# Patient Record
Sex: Female | Born: 1965 | Race: Black or African American | Hispanic: No | Smoking: Never smoker
Health system: Southern US, Community
[De-identification: ages and names within clinical notes are randomized; demographics above are authoritative.]

## PROBLEM LIST (undated history)

## (undated) ENCOUNTER — Emergency Department (HOSPITAL_COMMUNITY): Admission: EM | Payer: Self-pay | Source: Home / Self Care

## (undated) DIAGNOSIS — J45909 Unspecified asthma, uncomplicated: Secondary | ICD-10-CM

## (undated) DIAGNOSIS — M1990 Unspecified osteoarthritis, unspecified site: Secondary | ICD-10-CM

## (undated) DIAGNOSIS — I499 Cardiac arrhythmia, unspecified: Secondary | ICD-10-CM

## (undated) DIAGNOSIS — R0602 Shortness of breath: Secondary | ICD-10-CM

## (undated) DIAGNOSIS — R51 Headache: Secondary | ICD-10-CM

## (undated) HISTORY — DX: Unspecified asthma, uncomplicated: J45.909

---

## 2004-03-03 ENCOUNTER — Encounter: Admission: RE | Admit: 2004-03-03 | Discharge: 2004-03-03 | Payer: Self-pay | Admitting: Family Medicine

## 2004-03-17 ENCOUNTER — Encounter: Admission: RE | Admit: 2004-03-17 | Discharge: 2004-03-17 | Payer: Self-pay | Admitting: Anesthesiology

## 2004-06-09 ENCOUNTER — Other Ambulatory Visit: Admission: RE | Admit: 2004-06-09 | Discharge: 2004-06-09 | Payer: Self-pay | Admitting: Obstetrics and Gynecology

## 2004-06-17 ENCOUNTER — Ambulatory Visit (HOSPITAL_COMMUNITY): Admission: RE | Admit: 2004-06-17 | Discharge: 2004-06-17 | Payer: Self-pay | Admitting: Obstetrics and Gynecology

## 2004-06-27 ENCOUNTER — Encounter: Admission: RE | Admit: 2004-06-27 | Discharge: 2004-06-27 | Payer: Self-pay | Admitting: Obstetrics and Gynecology

## 2004-09-26 ENCOUNTER — Emergency Department (HOSPITAL_COMMUNITY): Admission: EM | Admit: 2004-09-26 | Discharge: 2004-09-26 | Payer: Self-pay | Admitting: Emergency Medicine

## 2005-07-06 ENCOUNTER — Inpatient Hospital Stay (HOSPITAL_COMMUNITY): Admission: AD | Admit: 2005-07-06 | Discharge: 2005-07-06 | Payer: Self-pay | Admitting: Obstetrics & Gynecology

## 2005-09-30 ENCOUNTER — Inpatient Hospital Stay (HOSPITAL_COMMUNITY): Admission: AD | Admit: 2005-09-30 | Discharge: 2005-09-30 | Payer: Self-pay | Admitting: Obstetrics and Gynecology

## 2005-10-01 ENCOUNTER — Emergency Department (HOSPITAL_COMMUNITY): Admission: EM | Admit: 2005-10-01 | Discharge: 2005-10-01 | Payer: Self-pay | Admitting: Emergency Medicine

## 2005-11-23 ENCOUNTER — Emergency Department (HOSPITAL_COMMUNITY): Admission: EM | Admit: 2005-11-23 | Discharge: 2005-11-23 | Payer: Self-pay | Admitting: Emergency Medicine

## 2005-11-29 ENCOUNTER — Emergency Department (HOSPITAL_COMMUNITY): Admission: EM | Admit: 2005-11-29 | Discharge: 2005-11-29 | Payer: Self-pay | Admitting: Emergency Medicine

## 2006-06-03 ENCOUNTER — Inpatient Hospital Stay (HOSPITAL_COMMUNITY): Admission: AD | Admit: 2006-06-03 | Discharge: 2006-06-03 | Payer: Self-pay | Admitting: Gynecology

## 2006-06-27 ENCOUNTER — Ambulatory Visit: Payer: Self-pay | Admitting: Obstetrics and Gynecology

## 2006-08-30 ENCOUNTER — Encounter: Admission: RE | Admit: 2006-08-30 | Discharge: 2006-09-13 | Payer: Self-pay | Admitting: Orthopedic Surgery

## 2007-04-14 ENCOUNTER — Emergency Department (HOSPITAL_COMMUNITY): Admission: EM | Admit: 2007-04-14 | Discharge: 2007-04-14 | Payer: Self-pay | Admitting: Emergency Medicine

## 2007-11-26 ENCOUNTER — Emergency Department (HOSPITAL_COMMUNITY): Admission: EM | Admit: 2007-11-26 | Discharge: 2007-11-26 | Payer: Self-pay | Admitting: Emergency Medicine

## 2007-11-28 ENCOUNTER — Ambulatory Visit: Payer: Self-pay | Admitting: Family Medicine

## 2007-11-28 LAB — CONVERTED CEMR LAB: TSH: 0.262 microintl units/mL — ABNORMAL LOW (ref 0.350–5.50)

## 2007-12-05 ENCOUNTER — Encounter (INDEPENDENT_AMBULATORY_CARE_PROVIDER_SITE_OTHER): Payer: Self-pay | Admitting: Family Medicine

## 2007-12-05 ENCOUNTER — Ambulatory Visit (HOSPITAL_COMMUNITY): Admission: RE | Admit: 2007-12-05 | Discharge: 2007-12-05 | Payer: Self-pay | Admitting: Family Medicine

## 2008-01-09 ENCOUNTER — Ambulatory Visit: Payer: Self-pay | Admitting: *Deleted

## 2008-01-09 ENCOUNTER — Emergency Department (HOSPITAL_COMMUNITY): Admission: EM | Admit: 2008-01-09 | Discharge: 2008-01-09 | Payer: Self-pay | Admitting: Emergency Medicine

## 2008-01-21 ENCOUNTER — Ambulatory Visit: Payer: Self-pay | Admitting: Internal Medicine

## 2008-02-20 ENCOUNTER — Encounter: Admission: RE | Admit: 2008-02-20 | Discharge: 2008-02-20 | Payer: Self-pay | Admitting: Family Medicine

## 2008-02-20 ENCOUNTER — Other Ambulatory Visit: Admission: RE | Admit: 2008-02-20 | Discharge: 2008-02-20 | Payer: Self-pay | Admitting: Interventional Radiology

## 2008-02-20 ENCOUNTER — Encounter (INDEPENDENT_AMBULATORY_CARE_PROVIDER_SITE_OTHER): Payer: Self-pay | Admitting: Interventional Radiology

## 2008-03-03 ENCOUNTER — Encounter (INDEPENDENT_AMBULATORY_CARE_PROVIDER_SITE_OTHER): Payer: Self-pay | Admitting: Internal Medicine

## 2008-03-03 ENCOUNTER — Ambulatory Visit: Payer: Self-pay | Admitting: Vascular Surgery

## 2008-03-03 ENCOUNTER — Inpatient Hospital Stay (HOSPITAL_COMMUNITY): Admission: EM | Admit: 2008-03-03 | Discharge: 2008-03-05 | Payer: Self-pay | Admitting: Emergency Medicine

## 2008-06-11 ENCOUNTER — Ambulatory Visit: Payer: Self-pay | Admitting: Internal Medicine

## 2008-07-03 ENCOUNTER — Ambulatory Visit: Payer: Self-pay | Admitting: Family Medicine

## 2008-07-03 LAB — CONVERTED CEMR LAB
AST: 16 units/L (ref 0–37)
Albumin: 3.8 g/dL (ref 3.5–5.2)
Alkaline Phosphatase: 41 units/L (ref 39–117)
BUN: 13 mg/dL (ref 6–23)
Basophils Relative: 0 % (ref 0–1)
Eosinophils Absolute: 0 10*3/uL (ref 0.0–0.7)
MCHC: 29.9 g/dL — ABNORMAL LOW (ref 30.0–36.0)
MCV: 72.6 fL — ABNORMAL LOW (ref 78.0–100.0)
Neutrophils Relative %: 53 % (ref 43–77)
Platelets: 252 10*3/uL (ref 150–400)
Potassium: 4.1 meq/L (ref 3.5–5.3)
RDW: 17.3 % — ABNORMAL HIGH (ref 11.5–15.5)
Sodium: 138 meq/L (ref 135–145)
Total Protein: 7.1 g/dL (ref 6.0–8.3)

## 2008-10-13 ENCOUNTER — Ambulatory Visit: Payer: Self-pay | Admitting: Family Medicine

## 2008-10-13 LAB — CONVERTED CEMR LAB
Basophils Absolute: 0 10*3/uL (ref 0.0–0.1)
Eosinophils Relative: 0 % (ref 0–5)
HCT: 32.7 % — ABNORMAL LOW (ref 36.0–46.0)
Hemoglobin: 9.6 g/dL — ABNORMAL LOW (ref 12.0–15.0)
Lymphocytes Relative: 35 % (ref 12–46)
Lymphs Abs: 1.9 10*3/uL (ref 0.7–4.0)
Monocytes Absolute: 0.4 10*3/uL (ref 0.1–1.0)
Monocytes Relative: 7 % (ref 3–12)
RDW: 19.2 % — ABNORMAL HIGH (ref 11.5–15.5)

## 2009-03-18 ENCOUNTER — Ambulatory Visit: Payer: Self-pay | Admitting: Family Medicine

## 2009-03-23 ENCOUNTER — Ambulatory Visit (HOSPITAL_COMMUNITY): Admission: RE | Admit: 2009-03-23 | Discharge: 2009-03-23 | Payer: Self-pay | Admitting: Family Medicine

## 2009-04-20 ENCOUNTER — Ambulatory Visit: Payer: Self-pay | Admitting: Family Medicine

## 2009-07-01 ENCOUNTER — Ambulatory Visit: Payer: Self-pay | Admitting: Family Medicine

## 2009-07-13 ENCOUNTER — Encounter (INDEPENDENT_AMBULATORY_CARE_PROVIDER_SITE_OTHER): Payer: Self-pay | Admitting: Family Medicine

## 2009-07-13 ENCOUNTER — Ambulatory Visit: Payer: Self-pay | Admitting: Family Medicine

## 2009-07-30 ENCOUNTER — Emergency Department (HOSPITAL_COMMUNITY): Admission: EM | Admit: 2009-07-30 | Discharge: 2009-07-30 | Payer: Self-pay | Admitting: Emergency Medicine

## 2009-10-14 ENCOUNTER — Ambulatory Visit: Payer: Self-pay | Admitting: Internal Medicine

## 2009-10-14 ENCOUNTER — Encounter (INDEPENDENT_AMBULATORY_CARE_PROVIDER_SITE_OTHER): Payer: Self-pay | Admitting: Family Medicine

## 2009-10-14 LAB — CONVERTED CEMR LAB
Basophils Absolute: 0 10*3/uL (ref 0.0–0.1)
Basophils Relative: 0 % (ref 0–1)
Eosinophils Absolute: 0 10*3/uL (ref 0.0–0.7)
Eosinophils Relative: 1 % (ref 0–5)
Free T4: 1.14 ng/dL (ref 0.80–1.80)
HCT: 31.2 % — ABNORMAL LOW (ref 36.0–46.0)
Hemoglobin: 9.8 g/dL — ABNORMAL LOW (ref 12.0–15.0)
LDL Cholesterol: 145 mg/dL — ABNORMAL HIGH (ref 0–99)
MCHC: 31.4 g/dL (ref 30.0–36.0)
MCV: 77 fL — ABNORMAL LOW (ref 78.0–100.0)
Monocytes Absolute: 0.5 10*3/uL (ref 0.1–1.0)
RDW: 15 % (ref 11.5–15.5)
Total CHOL/HDL Ratio: 4.7
VLDL: 24 mg/dL (ref 0–40)

## 2009-10-22 ENCOUNTER — Encounter (INDEPENDENT_AMBULATORY_CARE_PROVIDER_SITE_OTHER): Payer: Self-pay | Admitting: *Deleted

## 2009-12-31 ENCOUNTER — Ambulatory Visit: Payer: Self-pay | Admitting: Family Medicine

## 2010-03-02 ENCOUNTER — Ambulatory Visit: Payer: Self-pay | Admitting: Internal Medicine

## 2010-03-02 ENCOUNTER — Encounter (INDEPENDENT_AMBULATORY_CARE_PROVIDER_SITE_OTHER): Payer: Self-pay | Admitting: Family Medicine

## 2010-03-02 LAB — CONVERTED CEMR LAB
AST: 18 units/L (ref 0–37)
Albumin: 3.8 g/dL (ref 3.5–5.2)
Alkaline Phosphatase: 36 units/L — ABNORMAL LOW (ref 39–117)
BUN: 14 mg/dL (ref 6–23)
Basophils Absolute: 0 10*3/uL (ref 0.0–0.1)
Basophils Relative: 0 % (ref 0–1)
Eosinophils Relative: 1 % (ref 0–5)
Lymphocytes Relative: 46 % (ref 12–46)
MCHC: 29.2 g/dL — ABNORMAL LOW (ref 30.0–36.0)
Neutro Abs: 1.4 10*3/uL — ABNORMAL LOW (ref 1.7–7.7)
Platelets: 240 10*3/uL (ref 150–400)
Potassium: 3.6 meq/L (ref 3.5–5.3)
RDW: 19.8 % — ABNORMAL HIGH (ref 11.5–15.5)
Sodium: 140 meq/L (ref 135–145)
Total Bilirubin: 0.3 mg/dL (ref 0.3–1.2)
Vit D, 25-Hydroxy: 25 ng/mL — ABNORMAL LOW (ref 30–89)

## 2010-03-10 ENCOUNTER — Ambulatory Visit: Payer: Self-pay | Admitting: Family Medicine

## 2010-07-07 ENCOUNTER — Encounter (INDEPENDENT_AMBULATORY_CARE_PROVIDER_SITE_OTHER): Payer: Self-pay | Admitting: Family Medicine

## 2010-07-07 LAB — CONVERTED CEMR LAB
Basophils Relative: 0 % (ref 0–1)
Eosinophils Absolute: 0 10*3/uL (ref 0.0–0.7)
Eosinophils Relative: 1 % (ref 0–5)
HCT: 32.8 % — ABNORMAL LOW (ref 36.0–46.0)
Hemoglobin: 9.9 g/dL — ABNORMAL LOW (ref 12.0–15.0)
Lymphs Abs: 1.6 10*3/uL (ref 0.7–4.0)
MCHC: 30.2 g/dL (ref 30.0–36.0)
MCV: 78.7 fL (ref 78.0–100.0)
Monocytes Absolute: 0.5 10*3/uL (ref 0.1–1.0)
Monocytes Relative: 10 % (ref 3–12)
Neutrophils Relative %: 55 % (ref 43–77)
RBC: 4.17 M/uL (ref 3.87–5.11)
Vit D, 25-Hydroxy: 31 ng/mL (ref 30–89)
WBC: 4.8 10*3/uL (ref 4.0–10.5)

## 2010-08-22 ENCOUNTER — Ambulatory Visit (HOSPITAL_COMMUNITY)
Admission: RE | Admit: 2010-08-22 | Discharge: 2010-08-22 | Payer: Self-pay | Source: Home / Self Care | Admitting: Family Medicine

## 2010-10-09 ENCOUNTER — Encounter: Payer: Self-pay | Admitting: Family Medicine

## 2010-10-17 ENCOUNTER — Ambulatory Visit (HOSPITAL_COMMUNITY)
Admission: RE | Admit: 2010-10-17 | Discharge: 2010-10-17 | Payer: Self-pay | Source: Home / Self Care | Attending: Family Medicine | Admitting: Family Medicine

## 2010-10-18 NOTE — Letter (Signed)
Summary: Generic Letter  [facility]  [address]   [place], [place] [address]   Phone: [phone]  Fax: [phone]    10/22/2009  [facility] [address] [place], [place]  [address]  Dear Ms. Vanderhoef,  You have low Vitamin D start taking Vitamin D 50,000 International Units  one tablet weekly for 12 weeks. Your prescription is enclosed. You also need to take Calcium 1,200 mg one tablet daily which you can purchase at your drugstore.    After the 12 weeks start taking over the counter Vitamin D 1,000 International Units and continue with the calcium. Try to get 15 minutes of sun exposure daily.  Increase Calcium rich foods. If you have any questions please feel free to call us.         Sincerely,   Gaylyn Cheers RN

## 2010-11-04 ENCOUNTER — Emergency Department (HOSPITAL_COMMUNITY)
Admission: EM | Admit: 2010-11-04 | Discharge: 2010-11-05 | Disposition: A | Discharge status: Home / Self Care | Payer: Medicaid Other | Attending: Emergency Medicine | Admitting: Emergency Medicine

## 2010-11-04 DIAGNOSIS — M545 Low back pain, unspecified: Secondary | ICD-10-CM | POA: Insufficient documentation

## 2010-11-04 DIAGNOSIS — R109 Unspecified abdominal pain: Secondary | ICD-10-CM | POA: Insufficient documentation

## 2010-11-04 DIAGNOSIS — G8929 Other chronic pain: Secondary | ICD-10-CM | POA: Insufficient documentation

## 2010-11-05 LAB — URINALYSIS, ROUTINE W REFLEX MICROSCOPIC
Bilirubin Urine: NEGATIVE
Nitrite: NEGATIVE
Specific Gravity, Urine: 1.027 (ref 1.005–1.030)
Urobilinogen, UA: 0.2 mg/dL (ref 0.0–1.0)
pH: 6.5 (ref 5.0–8.0)

## 2010-11-05 LAB — POCT PREGNANCY, URINE: Preg Test, Ur: NEGATIVE

## 2010-12-21 LAB — POCT PREGNANCY, URINE: Preg Test, Ur: NEGATIVE

## 2010-12-21 LAB — CBC
HCT: 32.5 % — ABNORMAL LOW (ref 36.0–46.0)
Platelets: 192 10*3/uL (ref 150–400)
RBC: 4.06 MIL/uL (ref 3.87–5.11)
WBC: 5.5 10*3/uL (ref 4.0–10.5)

## 2010-12-21 LAB — URINALYSIS, ROUTINE W REFLEX MICROSCOPIC
Nitrite: NEGATIVE
Protein, ur: NEGATIVE mg/dL
Specific Gravity, Urine: 1.009 (ref 1.005–1.030)
Urobilinogen, UA: 0.2 mg/dL (ref 0.0–1.0)

## 2010-12-21 LAB — RPR: RPR Ser Ql: NONREACTIVE

## 2010-12-21 LAB — URINE MICROSCOPIC-ADD ON

## 2010-12-21 LAB — WET PREP, GENITAL
Trich, Wet Prep: NONE SEEN
WBC, Wet Prep HPF POC: NONE SEEN
Yeast Wet Prep HPF POC: NONE SEEN

## 2010-12-21 LAB — GC/CHLAMYDIA PROBE AMP, GENITAL: GC Probe Amp, Genital: NEGATIVE

## 2011-01-31 NOTE — H&P (Signed)
[name], [name]          ACCOUNT NO.:  [id number]   MEDICAL RECORD NO.:  [id number]          PATIENT TYPE:  INP   LOCATION:  1826                         FACILITY:  [facility]   PHYSICIAN:  Lonia Blood, M.D.      DATE OF BIRTH:  25-Jun-1966   DATE OF ADMISSION:  03/02/2008  DATE OF DISCHARGE:                              HISTORY & PHYSICAL   PRIMARY CARE PHYSICIAN:  Programmer, multimedia.   PRESENTING COMPLAINT:  Passing out.   HISTORY OF PRESENT ILLNESS:  The patient is a 45 year old [place]  immigrant with history of exertional dyspnea back in March, otherwise no  specific or significant past medical history presenting with complaint  of altered mental status.  The patient was apparently found to have  passed out yesterday.  She was found unresponsive and was not clear what  happened.  There was associated nausea and abdominal pain when the  patient came to.  The patient is currently drowsy, but able to give some  history.  She reported my heart feeling funny.  She described it as  some unusual feelings, more like aching, but not frank sharp pain.  She  also reported some pedal edema.  Per patient, she was given some  medicine before that makes her go to the bathroom a lot, sounded more  like diuretics.  She had a similar episode before when she was seen with  exertional dyspnea back in March.  She had a 2-D echo at that time that  showed an EF of 55-60%, an overall normal 2-D echocardiogram.  She  denied any headaches.  No fever.  No cough.  So far no vomiting,  although she was nauseated.   ALLERGIES:  She has no known drug allergies.   MEDICATIONS:  No specific medicine at this point, although the patient  reported taking some form of diuretics.   SOCIAL HISTORY:  The patient is married.  Lives in [place].  She is  an immigrant from [place].  Denied any tobacco, alcohol, or IV drug use.   FAMILY HISTORY:  No significant family history of any heart disease or  stroke.   REVIEW OF SYSTEMS:  A 12-point review of system is negative, except per  HPI.   PHYSICAL EXAMINATION:  VITAL SIGNS:  Temperature is 97.4, blood pressure  is 113/93, pulse 60, respiratory rate 22, and sat is 100% on room air.  GENERAL:  The patient is drowsy, but arousable in no acute distress.  HEENT:  PERRL. EOMI.  NECK:  Supple.  No JVD.  No lymphadenopathy.  RESPIRATORY:  She has good air entry bilaterally.  No wheezes.  No  rales.  CARDIOVASCULAR SYSTEM:  Shows S1 and S2.  No murmur.  ABDOMEN:  Soft and nontender with positive bowel sounds.  EXTREMITIES:  No edema, cyanosis, or clubbing.   LABORATORY DATA:  White count is 4.9 with normal differentials.  Hemoglobin 10.3 with MCV of 76.4.  Urinalysis is negative.  Initial  cardiac enzymes negative.  Sodium 136, potassium 3.8, chloride 107, CO2  21, glucose 97, BUN 6, creatinine 0.7, calcium 8.5, total protein 6.0,  albumin 3.2,  and normal LFTs.  D-dimer is 0.68.  Urine drug screen is  negative.  Head CT without contrast is negative.  CT angiogram of the  chest showed no evidence of pulmonary embolus.  There is enlargement of  thyroid gland, particularly the left lobe, which measured approximately  4.1 cm x 4.5 cm x 6 cm.  No lymphadenopathy.  No pleural or pericardial  effusions.   ASSESSMENT:  This is a 45 year old female presented with altered mental  status that sounds more like some form of arrhythmia.  Her EKG is  showing sinus bradycardia with a rate of 59.  No significant ST-T wave  changes, but poor R-wave progression.  The differentials for her  symptoms are varied.  It could be some arrhythmias leading to syncopal  episode.  The patient could have a CVA as well.  Another possibility is  this could be a seizure disorder.   PLAN:  1. Altered mental status.  We will admit the patient for syncopal      episode, work her up, check her MRI and MRA of the brain, carotid      Doppler with a 2-D echo.  Check B12 and  RPR level.  Put her on      telemetry for close monitoring.  I will follow her progress.  2. Chest discomfort or pain.  This again may be secondary to some      arrhythmias or true chest pain.  We will check serial cardiac      enzymes, TSH, and BNP; will see how the patient does in the      hospital.  3. History of exertional dyspnea.  This may be diastolic dysfunction      if anything.  As indicated, the patient had a 2-D echo back in      March that was essentially negative for anything acute.  We will      have a followup closely with a new 2-D echo and see if anything new      comes off.  Otherwise,  we will treat the patient as if she had a      CVA versus seizure.  EEG would also be ordered to be sure.      Lonia Blood, M.D.  Electronically Signed     LG/MEDQ  D:  03/03/2008  T:  03/03/2008  Job:  [id number]

## 2011-01-31 NOTE — Discharge Summary (Signed)
NAMEJORDANNE, [name]          ACCOUNT NO.:  [id number]   MEDICAL RECORD NO.:  [id number]           PATIENT TYPE:   LOCATION:                                 FACILITY:   PHYSICIAN:  Madaline Savage, MD        DATE OF BIRTH:  03-18-1966   DATE OF ADMISSION:  DATE OF DISCHARGE:                               DISCHARGE SUMMARY   PRIMARY CARE PHYSICIAN:  [facility].   DISCHARGE DIAGNOSES:  1. Syncope, likely vasovagal syncope.  2. Chest discomfort, ruled out acute coronary syndrome.   DISCHARGE MEDICATIONS:  None.   HISTORY OF PRESENT ILLNESS:  For full history and physical, see the  history and physical dictated by Dr. August Luz on March 02, 2008.  In short,  [name] is a 45 year old African lady who comes in with exertional  dyspnea in March but no other complaints here.  Came after she passed  out after she was having nausea and vomiting.  She was also complaining  of some chest discomfort.  She was admitted to work up for causes for  syncope.   PROCEDURES DONE IN THE HOSPITAL:  1. She had carotid Dopplers done on March 03, 2008, which showed no      significant plaque or ICA stenosis.  2. She had a 2-D echocardiogram done on March 03, 2008, which showed      normal left ventricular systolic function with ejection fraction of      55-65%, no diagnostic left ventricular wall motion abnormalities      were seen, and the left ventricular diastolic function parameters      were normal.  3. She had an EEG done on March 04, 2008, which showed normal study in      awake and drowsy and sleep state.  4. She had CT angiogram of the chest done on March 03, 2008, which      showed negative for pulmonary embolism or acute process and marked      enlargement of thyroid gland, particularly of the left lobe.  The      patient has had a thyroid ultrasound with biopsies here.  5. She had a CT of the head without contrast done on March 03, 2008,      which showed negative CT scan of the brain.  6.  She had an MRI and MRA of the brain done on March 03, 1008, which      showed no acute infarct, mild nonspecific white matter type      changes, minimal deposition within the globus pallidus bilaterally      as noted above and the MRA showed no large or medium vessel      significant stenosis or occlusion.   PROBLEM LIST:  1. Syncope.  [name] came here with syncope and some chest      discomfort.  She was admitted and a workup for syncope was done.      Her MRI, MRA, CT scan, carotid Dopplers, echocardiogram, and EEG      were all negative.  She was watched on the cardiac monitor for the  last 48 hours and it did not show any arrhythmias.  At this time      from the history, this is most likely a vasovagal syncope.  She      does not need any further treatment for it at this time.  2. Chest discomfort.  [name]  had atypical chest discomfort.  She      had 3 sets of cardiac markers which were negative.  Her EKG did not      show any ST-T wave changes.  She also had an elevated D-dimer and      so had a CT of the chest for PE protocol which was negative.  I      suspect this is likely secondary to her stress at home.  She did      also complain of some exertional dyspnea in the past, but her      echocardiogram did not show any abnormalities.  She will need      further workup as an outpatient by her primary care doctor.   DISPOSITION:  She is now being discharged home in stable condition.   FOLLOW UP:  She is asked to follow up with doctors at [facility] in 2-4  weeks.      Madaline Savage, MD  Electronically Signed     PKN/MEDQ  D:  03/05/2008  T:  03/05/2008  Job:  [id number]

## 2011-01-31 NOTE — Procedures (Signed)
EEG NUMBER:  [id number].   REQUESTING PHYSICIAN:  Lonia Blood, MD   CLINICAL HISTORY:  This is a 45 year old admitted with syncope.  EEG is  performed for evaluation.  The patient is described as awake and drowsy.  This is a portable EEG done at the bedside.   DESCRIPTION:  The dominant waking rhythm in this tracing is seen only  occasionally, that appears to be a moderate amplitude alpha rhythm of 10  Hz, which predominates posteriorly without abnormal asymmetry.  Mostly,  recording was made in drowsiness and asleep, as evidenced by  fragmentation of the background and generalized attenuation of rhythms  into a low amplitude theta state, with diffuse low amplitude fast  activity appearing over frontal central regions.  As asleep progresses,  some higher amplitude theta rhythms appeared, and eventually some  characteristics of stage II sleep including vertex waves and K-complexes  are seen.  No focal slowing is noted and no epileptiform discharges were  seen.  Single channel devoted to EKG revealed sinus rhythm throughout  with a rate approximately 66 beats per minute.   CONCLUSION:  Normal study in awake, drowsy. and sleep state.      Michael L. Thad Ranger, M.D.  Electronically Signed     ZOX:WRUE  D:  03/04/2008 17:19:44  T:  03/05/2008 05:14:57  Job #:  [id number]
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
## 2011-06-12 LAB — URINALYSIS, ROUTINE W REFLEX MICROSCOPIC
Bilirubin Urine: NEGATIVE
Ketones, ur: NEGATIVE
Leukocytes, UA: NEGATIVE
Nitrite: NEGATIVE
Urobilinogen, UA: 0.2

## 2011-06-12 LAB — B-NATRIURETIC PEPTIDE (CONVERTED LAB): Pro B Natriuretic peptide (BNP): <30

## 2011-06-12 LAB — I-STAT 8, (EC8 V) (CONVERTED LAB)
Acid-base deficit: 5 — ABNORMAL HIGH
Bicarbonate: 19.1 — ABNORMAL LOW
Potassium: 4.2
TCO2: 20
pH, Ven: 7.365 — ABNORMAL HIGH

## 2011-06-12 LAB — CBC
HCT: 33.9 — ABNORMAL LOW
Hemoglobin: 11 — ABNORMAL LOW
MCHC: 32.4
MCV: 75.8 — ABNORMAL LOW
RBC: 4.48

## 2011-06-12 LAB — DIFFERENTIAL
Basophils Relative: 0
Eosinophils Absolute: 0
Eosinophils Relative: 1
Monocytes Absolute: 0.6
Monocytes Relative: 13 — ABNORMAL HIGH
Neutro Abs: 2.7

## 2011-06-12 LAB — URINE CULTURE: Colony Count: NO GROWTH

## 2011-06-12 LAB — POCT CARDIAC MARKERS
CKMB, poc: <1 — ABNORMAL LOW
Myoglobin, poc: 38.7
Troponin i, poc: <0.05

## 2011-06-12 LAB — PREGNANCY, URINE: Preg Test, Ur: NEGATIVE

## 2011-06-15 LAB — COMPREHENSIVE METABOLIC PANEL
AST: 23
Albumin: 3.2 — ABNORMAL LOW
BUN: 6
Chloride: 107
Creatinine, Ser: 0.7
GFR calc Af Amer: >60
Potassium: 3.8
Total Bilirubin: 0.5
Total Protein: 6.1

## 2011-06-15 LAB — BASIC METABOLIC PANEL
BUN: 8
CO2: 29
Glucose, Bld: 84
Potassium: 3.5
Sodium: 139

## 2011-06-15 LAB — TROPONIN I: Troponin I: <0.01

## 2011-06-15 LAB — URINALYSIS, ROUTINE W REFLEX MICROSCOPIC
Glucose, UA: NEGATIVE
Ketones, ur: NEGATIVE
Nitrite: NEGATIVE
Protein, ur: NEGATIVE
Urobilinogen, UA: 0.2

## 2011-06-15 LAB — VITAMIN B12: Vitamin B-12: 995 — ABNORMAL HIGH (ref 211–911)

## 2011-06-15 LAB — CARDIAC PANEL(CRET KIN+CKTOT+MB+TROPI)
CK, MB: 0.9
Total CK: 68

## 2011-06-15 LAB — DIFFERENTIAL
Eosinophils Absolute: 0.1
Eosinophils Relative: 2
Lymphocytes Relative: 39
Lymphs Abs: 1.9
Monocytes Relative: 12

## 2011-06-15 LAB — TSH: TSH: 0.486

## 2011-06-15 LAB — POCT CARDIAC MARKERS
CKMB, poc: <1 — ABNORMAL LOW
Myoglobin, poc: 51.5
Operator id: 277751
Operator id: 277751
Troponin i, poc: 0.1 — ABNORMAL HIGH
Troponin i, poc: <0.05

## 2011-06-15 LAB — CBC
HCT: 29.8 — ABNORMAL LOW
HCT: 31.5 — ABNORMAL LOW
Hemoglobin: 9.9 — ABNORMAL LOW
MCHC: 33.1
MCV: 74.6 — ABNORMAL LOW
MCV: 76.4 — ABNORMAL LOW
RBC: 4.13
RDW: 18.3 — ABNORMAL HIGH
WBC: 4.9

## 2011-06-15 LAB — PREGNANCY, URINE: Preg Test, Ur: NEGATIVE

## 2011-06-15 LAB — LIPID PANEL
Cholesterol: 196
LDL Cholesterol: 138 — ABNORMAL HIGH

## 2011-06-15 LAB — D-DIMER, QUANTITATIVE: D-Dimer, Quant: 0.68 — ABNORMAL HIGH

## 2011-06-15 LAB — CK TOTAL AND CKMB (NOT AT ARMC)
CK, MB: 1
Total CK: 73

## 2011-06-15 LAB — B-NATRIURETIC PEPTIDE (CONVERTED LAB): Pro B Natriuretic peptide (BNP): 39

## 2011-06-15 LAB — RAPID URINE DRUG SCREEN, HOSP PERFORMED: Barbiturates: NOT DETECTED

## 2011-06-20 ENCOUNTER — Emergency Department (HOSPITAL_COMMUNITY)
Admission: EM | Admit: 2011-06-20 | Discharge: 2011-06-20 | Disposition: A | Discharge status: Home / Self Care | Payer: Medicaid Other | Attending: Emergency Medicine | Admitting: Emergency Medicine

## 2011-06-20 ENCOUNTER — Emergency Department (HOSPITAL_COMMUNITY): Payer: Medicaid Other

## 2011-06-20 DIAGNOSIS — R1013 Epigastric pain: Secondary | ICD-10-CM | POA: Insufficient documentation

## 2011-06-20 DIAGNOSIS — R112 Nausea with vomiting, unspecified: Secondary | ICD-10-CM | POA: Insufficient documentation

## 2011-06-20 LAB — DIFFERENTIAL
Basophils Absolute: 0 10*3/uL (ref 0.0–0.1)
Basophils Relative: 0 % (ref 0–1)
Eosinophils Absolute: 0.1 10*3/uL (ref 0.0–0.7)
Eosinophils Relative: 1 % (ref 0–5)
Monocytes Absolute: 0.6 10*3/uL (ref 0.1–1.0)

## 2011-06-20 LAB — CBC
HCT: 37.1 % (ref 36.0–46.0)
MCHC: 33.4 g/dL (ref 30.0–36.0)
Platelets: 210 10*3/uL (ref 150–400)
RDW: 13.7 % (ref 11.5–15.5)

## 2011-06-20 LAB — COMPREHENSIVE METABOLIC PANEL
ALT: 14 U/L (ref 0–35)
AST: 19 U/L (ref 0–37)
CO2: 27 mEq/L (ref 19–32)
Chloride: 100 mEq/L (ref 96–112)
Creatinine, Ser: 0.6 mg/dL (ref 0.50–1.10)
GFR calc non Af Amer: >90 mL/min (ref >90)
Sodium: 136 mEq/L (ref 135–145)
Total Bilirubin: 0.3 mg/dL (ref 0.3–1.2)

## 2011-06-20 LAB — URINALYSIS, ROUTINE W REFLEX MICROSCOPIC
Glucose, UA: NEGATIVE mg/dL
Ketones, ur: NEGATIVE mg/dL
Leukocytes, UA: NEGATIVE
Protein, ur: NEGATIVE mg/dL
Urobilinogen, UA: 0.2 mg/dL (ref 0.0–1.0)

## 2011-06-20 LAB — POCT PREGNANCY, URINE: Preg Test, Ur: NEGATIVE

## 2011-06-20 LAB — URINE MICROSCOPIC-ADD ON

## 2011-06-28 ENCOUNTER — Emergency Department (HOSPITAL_COMMUNITY)
Admission: EM | Admit: 2011-06-28 | Discharge: 2011-06-29 | Disposition: A | Discharge status: Home / Self Care | Payer: Medicaid Other | Attending: Emergency Medicine | Admitting: Emergency Medicine

## 2011-06-28 DIAGNOSIS — G8929 Other chronic pain: Secondary | ICD-10-CM | POA: Insufficient documentation

## 2011-06-28 DIAGNOSIS — R109 Unspecified abdominal pain: Secondary | ICD-10-CM | POA: Insufficient documentation

## 2011-06-28 DIAGNOSIS — N838 Other noninflammatory disorders of ovary, fallopian tube and broad ligament: Secondary | ICD-10-CM | POA: Insufficient documentation

## 2011-06-28 DIAGNOSIS — M545 Low back pain, unspecified: Secondary | ICD-10-CM | POA: Insufficient documentation

## 2011-06-29 ENCOUNTER — Emergency Department (HOSPITAL_COMMUNITY): Payer: Medicaid Other

## 2011-06-29 LAB — CBC
HCT: 35.3 % — ABNORMAL LOW (ref 36.0–46.0)
Hemoglobin: 11.8 g/dL — ABNORMAL LOW (ref 12.0–15.0)
MCH: 26.3 pg (ref 26.0–34.0)
MCHC: 33.4 g/dL (ref 30.0–36.0)
MCV: 78.8 fL (ref 78.0–100.0)
Platelets: 184 K/uL (ref 150–400)
RBC: 4.48 MIL/uL (ref 3.87–5.11)
RDW: 14.1 % (ref 11.5–15.5)
WBC: 5.4 K/uL (ref 4.0–10.5)

## 2011-06-29 LAB — DIFFERENTIAL
Basophils Absolute: 0 K/uL (ref 0.0–0.1)
Basophils Relative: 0 % (ref 0–1)
Eosinophils Absolute: 0.1 K/uL (ref 0.0–0.7)
Eosinophils Relative: 1 % (ref 0–5)
Lymphocytes Relative: 43 % (ref 12–46)
Lymphs Abs: 2.3 K/uL (ref 0.7–4.0)
Monocytes Absolute: 0.5 K/uL (ref 0.1–1.0)
Monocytes Relative: 10 % (ref 3–12)
Neutro Abs: 2.5 K/uL (ref 1.7–7.7)
Neutrophils Relative %: 46 % (ref 43–77)

## 2011-06-29 LAB — POCT I-STAT, CHEM 8
BUN: 10 mg/dL (ref 6–23)
Calcium, Ion: 1.17 mmol/L (ref 1.12–1.32)
Chloride: 101 meq/L (ref 96–112)
Creatinine, Ser: 0.9 mg/dL (ref 0.50–1.10)
Glucose, Bld: 101 mg/dL — ABNORMAL HIGH (ref 70–99)
HCT: 39 % (ref 36.0–46.0)
Hemoglobin: 13.3 g/dL (ref 12.0–15.0)
Potassium: 4 meq/L (ref 3.5–5.1)
Sodium: 138 meq/L (ref 135–145)
TCO2: 27 mmol/L (ref 0–100)

## 2011-06-29 LAB — URINALYSIS, ROUTINE W REFLEX MICROSCOPIC
Glucose, UA: NEGATIVE mg/dL
Ketones, ur: NEGATIVE mg/dL
Protein, ur: NEGATIVE mg/dL

## 2011-06-29 LAB — URINE MICROSCOPIC-ADD ON

## 2011-07-03 LAB — COMPREHENSIVE METABOLIC PANEL
ALT: 22
AST: 24
Alkaline Phosphatase: 35 — ABNORMAL LOW
CO2: 24
Chloride: 106
GFR calc non Af Amer: >60
Glucose, Bld: 95
Potassium: 4
Sodium: 137
Total Bilirubin: 0.4

## 2011-07-03 LAB — CBC
Hemoglobin: 11.5 — ABNORMAL LOW
MCHC: 33
RBC: 4.43
WBC: 4

## 2011-07-03 LAB — B-NATRIURETIC PEPTIDE (CONVERTED LAB): Pro B Natriuretic peptide (BNP): <30

## 2011-07-03 LAB — URINALYSIS, ROUTINE W REFLEX MICROSCOPIC
Bilirubin Urine: NEGATIVE
Nitrite: NEGATIVE
Specific Gravity, Urine: 1.016
pH: 7.5

## 2011-07-03 LAB — DIFFERENTIAL
Basophils Relative: 1
Eosinophils Absolute: 0
Eosinophils Relative: 1
Neutrophils Relative %: 55

## 2011-07-03 LAB — POCT CARDIAC MARKERS: Operator id: 1211

## 2011-09-11 ENCOUNTER — Emergency Department (HOSPITAL_COMMUNITY)
Admission: EM | Admit: 2011-09-11 | Discharge: 2011-09-12 | Disposition: A | Discharge status: Home / Self Care | Payer: Medicaid Other | Attending: Emergency Medicine | Admitting: Emergency Medicine

## 2011-09-11 DIAGNOSIS — N898 Other specified noninflammatory disorders of vagina: Secondary | ICD-10-CM | POA: Insufficient documentation

## 2011-09-11 DIAGNOSIS — N939 Abnormal uterine and vaginal bleeding, unspecified: Secondary | ICD-10-CM

## 2011-09-11 DIAGNOSIS — D649 Anemia, unspecified: Secondary | ICD-10-CM

## 2011-09-11 DIAGNOSIS — R109 Unspecified abdominal pain: Secondary | ICD-10-CM | POA: Insufficient documentation

## 2011-09-11 NOTE — ED Notes (Signed)
Respiratory difficulty -sat 97 percent room air - clear and equal lung fields. - chest wall pain on Left side - increases with palpation. Seen here on 18th - given naproxen for same complaints.

## 2011-09-12 ENCOUNTER — Emergency Department (HOSPITAL_COMMUNITY): Payer: Medicaid Other

## 2011-09-12 ENCOUNTER — Other Ambulatory Visit: Payer: Self-pay

## 2011-09-12 LAB — BASIC METABOLIC PANEL
BUN: 12 mg/dL (ref 6–23)
Creatinine, Ser: 0.76 mg/dL (ref 0.50–1.10)
GFR calc Af Amer: >90 mL/min (ref >90)
GFR calc non Af Amer: >90 mL/min (ref >90)
Potassium: 3.3 mEq/L — ABNORMAL LOW (ref 3.5–5.1)

## 2011-09-12 LAB — URINE MICROSCOPIC-ADD ON

## 2011-09-12 LAB — URINALYSIS, ROUTINE W REFLEX MICROSCOPIC
Bilirubin Urine: NEGATIVE
Ketones, ur: NEGATIVE mg/dL
Protein, ur: NEGATIVE mg/dL
Urobilinogen, UA: 0.2 mg/dL (ref 0.0–1.0)

## 2011-09-12 LAB — DIFFERENTIAL
Basophils Relative: 0 % (ref 0–1)
Eosinophils Absolute: 0.1 10*3/uL (ref 0.0–0.7)
Monocytes Relative: 10 % (ref 3–12)
Neutrophils Relative %: 40 % — ABNORMAL LOW (ref 43–77)

## 2011-09-12 LAB — CBC
Hemoglobin: 10.7 g/dL — ABNORMAL LOW (ref 12.0–15.0)
MCH: 25 pg — ABNORMAL LOW (ref 26.0–34.0)
MCHC: 31.6 g/dL (ref 30.0–36.0)
Platelets: 224 10*3/uL (ref 150–400)

## 2011-09-12 MED ORDER — OMEPRAZOLE 20 MG PO CPDR: 20.0000 mg | DELAYED_RELEASE_CAPSULE | Freq: Every day | ORAL | Status: DC

## 2011-09-12 MED ORDER — HYDROCODONE-ACETAMINOPHEN 5-325 MG PO TABS: 1.0000 | ORAL_TABLET | ORAL | Status: AC | PRN

## 2011-09-12 NOTE — ED Provider Notes (Signed)
45 year old female comes in with a epigastric and left upper quadrant pain. Exam shows tenderness in the same area. Workup is negative and review of old records there is associated a CT of her abdomen and ultrasound of her abdomen in October which were unremarkable. She will need to be followed up with GI as you may need endoscopy to further evaluate the her pain. She will be given a therapeutic trial of proton pump inhibitor. Also she has been having vaginal bleeding and she is referred to GYN for followup, and she has anemia and will need to have a followup hemoglobin.  Dione Booze, MD 09/12/11 [phone]

## 2012-02-19 ENCOUNTER — Encounter: Payer: Self-pay | Admitting: Obstetrics and Gynecology

## 2012-06-25 IMAGING — CT CT ABD-PELV W/O CM
2 of 4 series · 17 of 46 positions shown, 19 images · non-contrast
Comparison: 06/20/2011 ultrasound

CLINICAL DATA: Left flank pain

CT ABDOMEN AND PELVIS WITHOUT CONTRAST
TECHNIQUE: Multidetector CT imaging of the abdomen and pelvis was
performed following the standard protocol without intravenous
contrast.

[Series 2: a/p w/o 5.0 b31f st · axial · non-contrast · 0.78mm/px · z∈[-684,-274]mm · 14 of 90 slices shown, 16 images]
[im 4/90  soft-tissue]
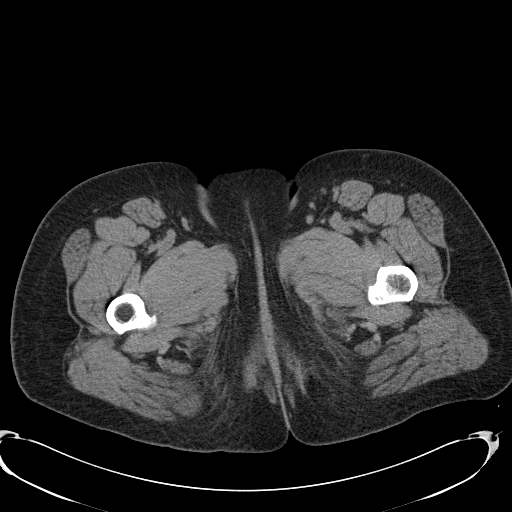
[im 4/90  bone]
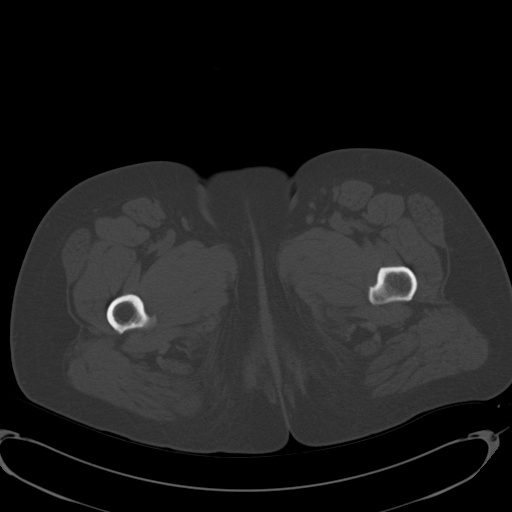
[im 12/90  soft-tissue]
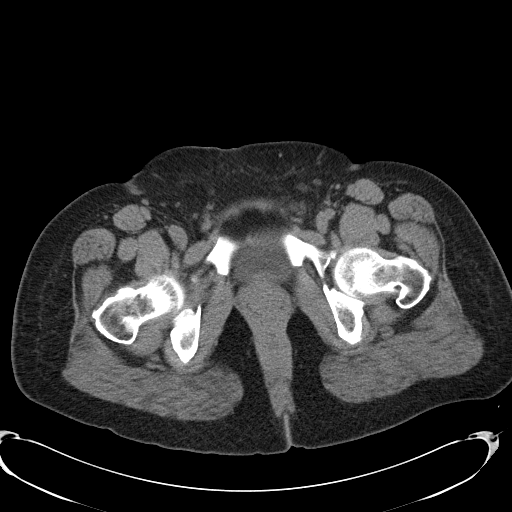
[im 19/90  soft-tissue]
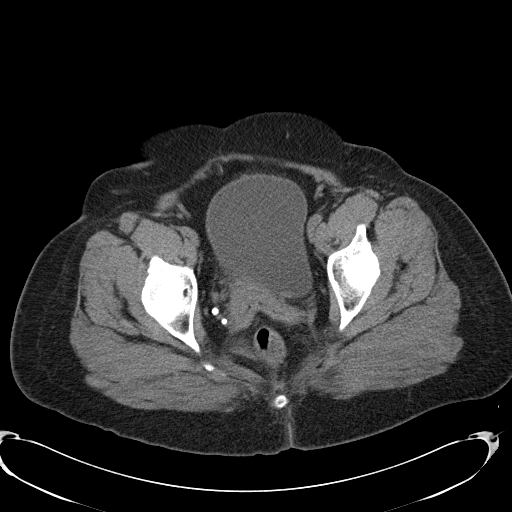
[im 23/90  soft-tissue]
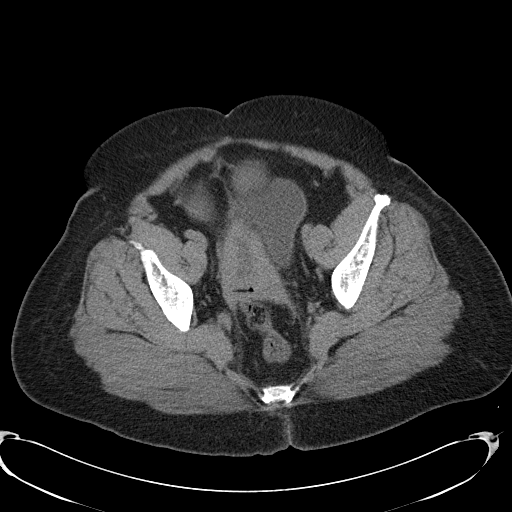
[im 30/90  soft-tissue]
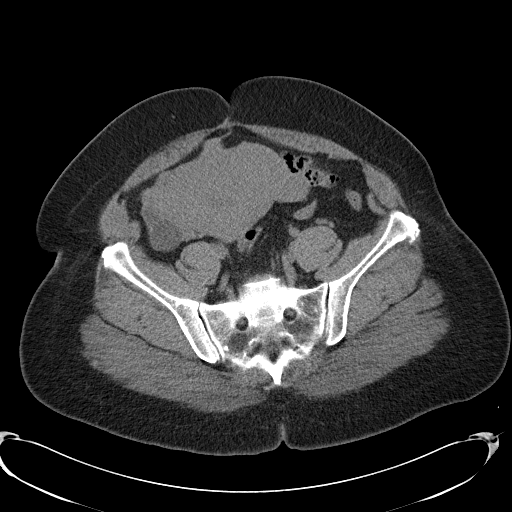
[im 38/90  soft-tissue]
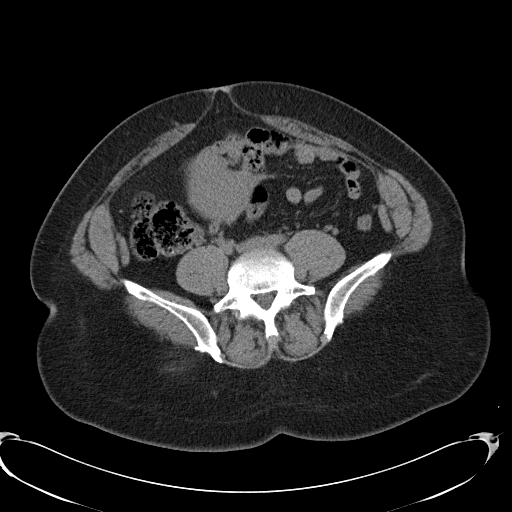
[im 41/90  soft-tissue]
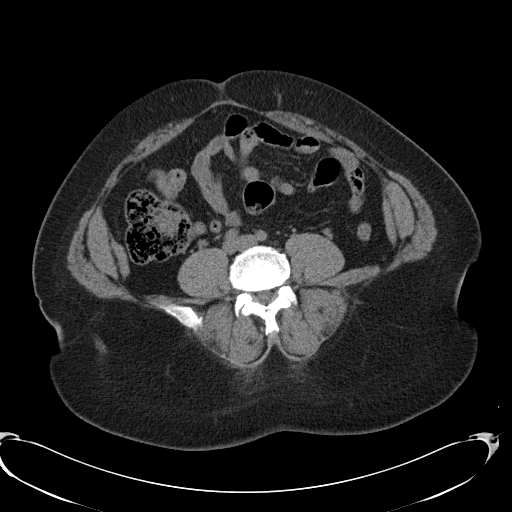
[im 49/90  soft-tissue]
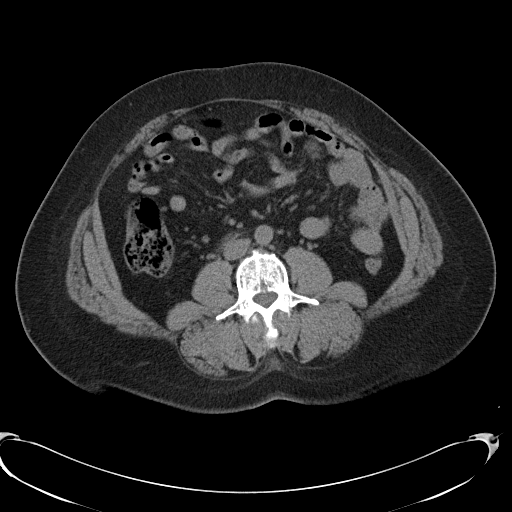
[im 52/90  soft-tissue]
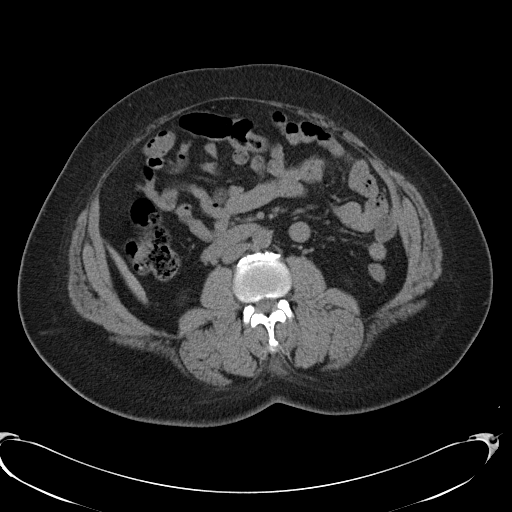
[im 52/90  bone]
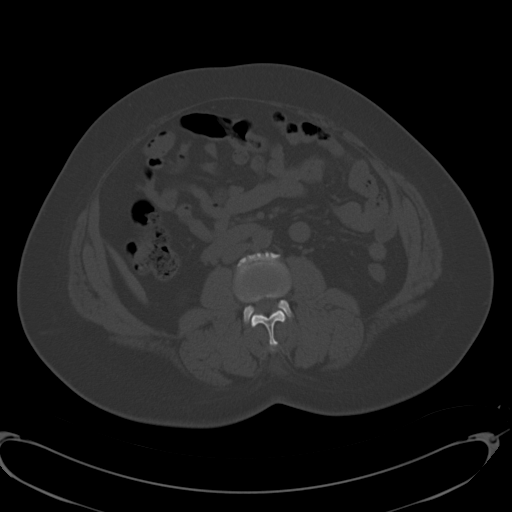
[im 60/90  soft-tissue]
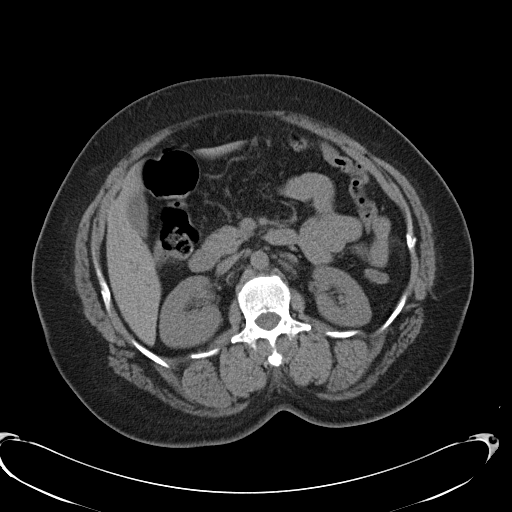
[im 67/90  soft-tissue]
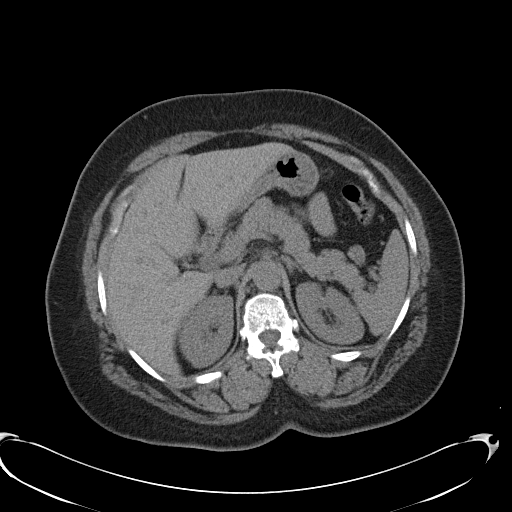
[im 71/90  soft-tissue]
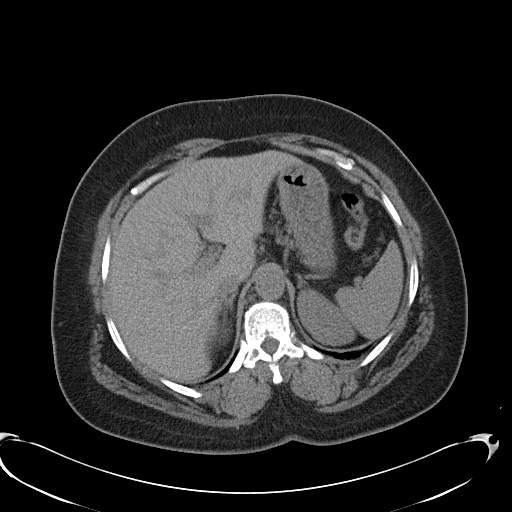
[im 78/90  soft-tissue]
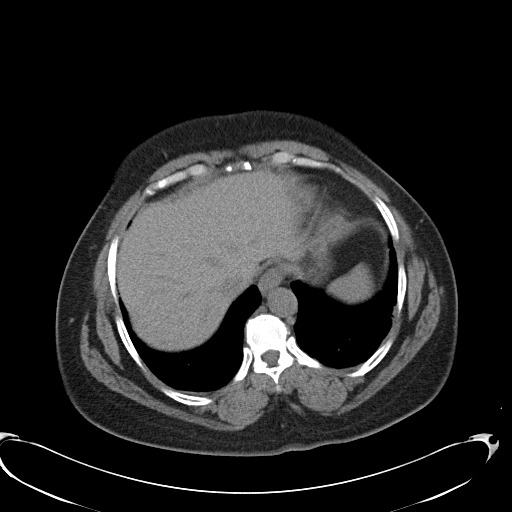
[im 86/90  soft-tissue]
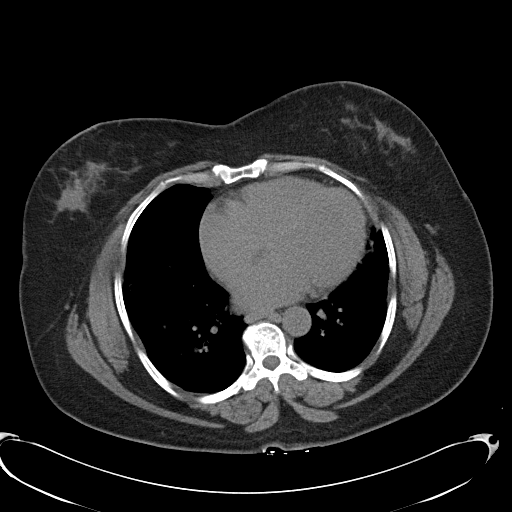

[Series 602: cor · coronal · 0.88mm/px · 3 of 131 slices shown]
[im 44/131  soft-tissue]
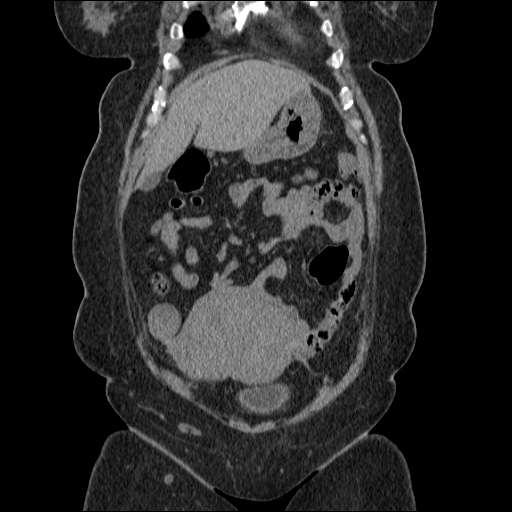
[im 58/131  soft-tissue]
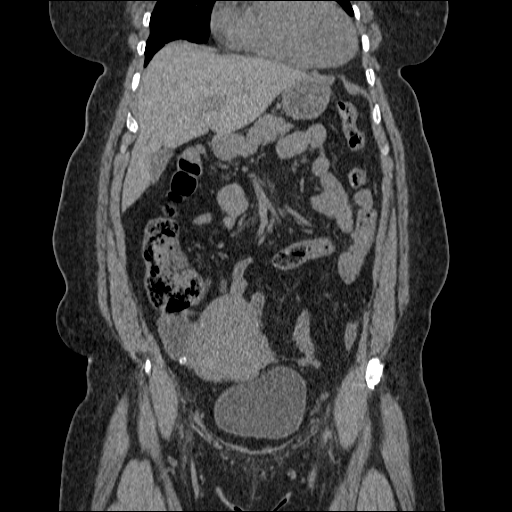
[im 73/131  soft-tissue]
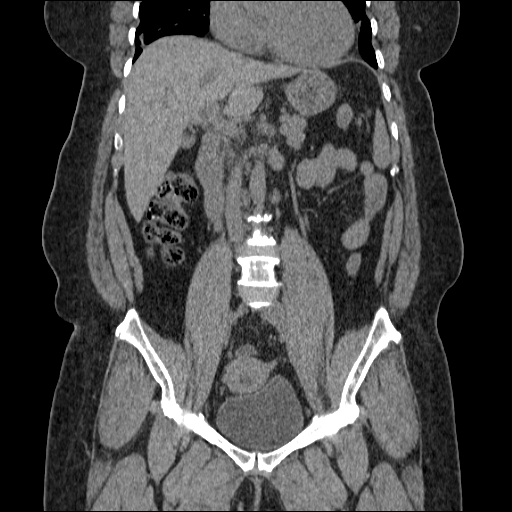

[17 of 46 positions shown; findings below may reference images not displayed]

FINDINGS: Mild linear lingular, middle lobe, and left lower lobe
opacities are favored to represent scarring versus atelectasis.

Intra-abdominal organ evaluation is limited without intravenous
contrast.  Within this limitation, unremarkable liver, biliary
system, spleen, pancreas, adrenal glands.  Symmetric renal size.
No hydronephrosis or hydroureter.  No urinary tract calculi
identified.

Fibroid uterus.  Nonspecific appearance to the right adnexa,
favored to represent a physiologic cyst.  Thin-walled bladder.

No bowel obstruction.  No CT evidence for colitis.  Normal
appendix.  No lymphadenopathy.  No free intraperitoneal air or
fluid.  Normal caliber vasculature.

No acute osseous abnormality. The foci of gas within the
subcutaneous fat of the left buttock, likely corresponds to
injection site.
IMPRESSION: No hydronephrosis or urinary tract calculi identified.

Normal appendix.

Fibroid uterus.

Nonspecific appearance to the right adnexa, may correspond to an
exophytic fibroid and physiologic ovarian cyst. Could be further
characterized with a non emergent ultrasound follow-up.

## 2012-08-21 ENCOUNTER — Emergency Department (HOSPITAL_COMMUNITY): Payer: Self-pay

## 2012-08-21 ENCOUNTER — Emergency Department (HOSPITAL_COMMUNITY)
Admission: EM | Admit: 2012-08-21 | Discharge: 2012-08-21 | Disposition: A | Discharge status: Home / Self Care | Payer: Self-pay | Attending: Emergency Medicine | Admitting: Emergency Medicine

## 2012-08-21 ENCOUNTER — Encounter (HOSPITAL_COMMUNITY): Payer: Self-pay

## 2012-08-21 DIAGNOSIS — R071 Chest pain on breathing: Secondary | ICD-10-CM | POA: Insufficient documentation

## 2012-08-21 DIAGNOSIS — R0789 Other chest pain: Secondary | ICD-10-CM

## 2012-08-21 LAB — URINALYSIS, ROUTINE W REFLEX MICROSCOPIC
Glucose, UA: NEGATIVE mg/dL
Hgb urine dipstick: NEGATIVE
Leukocytes, UA: NEGATIVE
Specific Gravity, Urine: 1.026 (ref 1.005–1.030)
Urobilinogen, UA: 1 mg/dL (ref 0.0–1.0)

## 2012-08-21 LAB — CBC WITH DIFFERENTIAL/PLATELET
Lymphocytes Relative: 41 % (ref 12–46)
Lymphs Abs: 1.8 10*3/uL (ref 0.7–4.0)
Neutrophils Relative %: 50 % (ref 43–77)
Platelets: 191 10*3/uL (ref 150–400)
RBC: 4.5 MIL/uL (ref 3.87–5.11)
WBC: 4.5 10*3/uL (ref 4.0–10.5)

## 2012-08-21 LAB — BASIC METABOLIC PANEL
CO2: 28 mEq/L (ref 19–32)
GFR calc non Af Amer: 72 mL/min — ABNORMAL LOW (ref >90)
Glucose, Bld: 90 mg/dL (ref 70–99)
Potassium: 3.5 mEq/L (ref 3.5–5.1)
Sodium: 142 mEq/L (ref 135–145)

## 2012-08-21 LAB — POCT I-STAT TROPONIN I: Troponin i, poc: 0 ng/mL (ref 0.00–0.08)

## 2012-08-21 MED ORDER — ACETAMINOPHEN 325 MG PO TABS
650.0000 mg | ORAL_TABLET | Freq: Once | ORAL | Status: AC
  Administered 2012-08-21: 650 mg via ORAL
  Filled 2012-08-21: qty 2

## 2012-08-21 MED ORDER — HYDROCODONE-ACETAMINOPHEN 5-325 MG PO TABS: 2.0000 | ORAL_TABLET | ORAL | Status: DC | PRN

## 2012-08-21 NOTE — ED Notes (Signed)
Doctor J had issues with communicating with the patient.  The patient verbalized understanding of discharge instructions and follow up care.  Patient did not verbalize any need for interpreter.
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
## 2012-12-04 ENCOUNTER — Emergency Department (HOSPITAL_COMMUNITY): Payer: Medicaid Other

## 2012-12-04 ENCOUNTER — Inpatient Hospital Stay (HOSPITAL_COMMUNITY)
Admission: EM | Admit: 2012-12-04 | Discharge: 2012-12-06 | DRG: 811 | Disposition: A | Discharge status: Home / Self Care | Payer: Medicaid Other | Attending: Internal Medicine | Admitting: Internal Medicine

## 2012-12-04 ENCOUNTER — Encounter (HOSPITAL_COMMUNITY): Payer: Self-pay

## 2012-12-04 DIAGNOSIS — N920 Excessive and frequent menstruation with regular cycle: Secondary | ICD-10-CM | POA: Diagnosis present

## 2012-12-04 DIAGNOSIS — Z792 Long term (current) use of antibiotics: Secondary | ICD-10-CM

## 2012-12-04 DIAGNOSIS — J189 Pneumonia, unspecified organism: Secondary | ICD-10-CM | POA: Diagnosis present

## 2012-12-04 DIAGNOSIS — I2510 Atherosclerotic heart disease of native coronary artery without angina pectoris: Secondary | ICD-10-CM | POA: Diagnosis present

## 2012-12-04 DIAGNOSIS — D509 Iron deficiency anemia, unspecified: Principal | ICD-10-CM | POA: Diagnosis present

## 2012-12-04 DIAGNOSIS — D259 Leiomyoma of uterus, unspecified: Secondary | ICD-10-CM | POA: Diagnosis present

## 2012-12-04 DIAGNOSIS — Z79899 Other long term (current) drug therapy: Secondary | ICD-10-CM

## 2012-12-04 DIAGNOSIS — D649 Anemia, unspecified: Secondary | ICD-10-CM

## 2012-12-04 DIAGNOSIS — E0590 Thyrotoxicosis, unspecified without thyrotoxic crisis or storm: Secondary | ICD-10-CM | POA: Diagnosis present

## 2012-12-04 DIAGNOSIS — Z23 Encounter for immunization: Secondary | ICD-10-CM

## 2012-12-04 LAB — CBC WITH DIFFERENTIAL/PLATELET
Basophils Absolute: 0 10*3/uL (ref 0.0–0.1)
Basophils Relative: 0 % (ref 0–1)
Eosinophils Absolute: 0 10*3/uL (ref 0.0–0.7)
Hemoglobin: 12.4 g/dL (ref 12.0–15.0)
Lymphocytes Relative: 37 % (ref 12–46)
MCH: 27 pg (ref 26.0–34.0)
MCHC: 33.9 g/dL (ref 30.0–36.0)
Monocytes Absolute: 0.5 10*3/uL (ref 0.1–1.0)
Neutrophils Relative %: 55 % (ref 43–77)
Platelets: 145 10*3/uL — ABNORMAL LOW (ref 150–400)
RDW: 12.9 % (ref 11.5–15.5)

## 2012-12-04 LAB — TROPONIN I: Troponin I: <0.3 ng/mL (ref <0.30)

## 2012-12-04 LAB — PRO B NATRIURETIC PEPTIDE: Pro B Natriuretic peptide (BNP): 17.5 pg/mL (ref 0–125)

## 2012-12-04 LAB — POCT I-STAT, CHEM 8
Calcium, Ion: 1.12 mmol/L (ref 1.12–1.23)
HCT: 39 % (ref 36.0–46.0)
Hemoglobin: 13.3 g/dL (ref 12.0–15.0)
Sodium: 141 mEq/L (ref 135–145)
TCO2: 28 mmol/L (ref 0–100)

## 2012-12-04 MED ORDER — SODIUM CHLORIDE 0.9 % IV BOLUS (SEPSIS)
1000.0000 mL | Freq: Once | INTRAVENOUS | Status: AC
  Administered 2012-12-04: 1000 mL via INTRAVENOUS

## 2012-12-04 MED ORDER — ONDANSETRON HCL 4 MG/2ML IJ SOLN: 4.0000 mg | Freq: Four times a day (QID) | INTRAMUSCULAR | Status: DC | PRN

## 2012-12-04 MED ORDER — ALBUTEROL SULFATE HFA 108 (90 BASE) MCG/ACT IN AERS: 1.0000 | INHALATION_SPRAY | Freq: Four times a day (QID) | RESPIRATORY_TRACT | Status: DC | PRN

## 2012-12-04 MED ORDER — ALBUTEROL SULFATE (5 MG/ML) 0.5% IN NEBU
2.5000 mg | INHALATION_SOLUTION | Freq: Four times a day (QID) | RESPIRATORY_TRACT | Status: DC
Start: 2012-12-04 — End: 2012-12-05
  Administered 2012-12-04: 2.5 mg via RESPIRATORY_TRACT
  Filled 2012-12-04: qty 0.5

## 2012-12-04 MED ORDER — SODIUM CHLORIDE 0.9 % IV SOLN
INTRAVENOUS | Status: AC
  Administered 2012-12-04: 22:00:00 via INTRAVENOUS

## 2012-12-04 MED ORDER — AZITHROMYCIN 250 MG PO TABS: ORAL_TABLET | ORAL | Status: DC

## 2012-12-04 MED ORDER — ALBUTEROL SULFATE (5 MG/ML) 0.5% IN NEBU: 2.5000 mg | INHALATION_SOLUTION | RESPIRATORY_TRACT | Status: DC | PRN

## 2012-12-04 MED ORDER — INFLUENZA VIRUS VACC SPLIT PF IM SUSP
0.5000 mL | INTRAMUSCULAR | Status: AC
  Administered 2012-12-05: 0.5 mL via INTRAMUSCULAR
  Filled 2012-12-04: qty 0.5

## 2012-12-04 MED ORDER — ACETAMINOPHEN 325 MG PO TABS
650.0000 mg | ORAL_TABLET | Freq: Four times a day (QID) | ORAL | Status: DC | PRN
  Administered 2012-12-04 – 2012-12-05 (×2): 650 mg via ORAL
  Filled 2012-12-04 (×2): qty 2

## 2012-12-04 MED ORDER — DEXTROSE 5 % IV SOLN
1.0000 g | Freq: Once | INTRAVENOUS | Status: AC
  Administered 2012-12-04: 1 g via INTRAVENOUS
  Filled 2012-12-04: qty 10

## 2012-12-04 MED ORDER — ALBUTEROL SULFATE (5 MG/ML) 0.5% IN NEBU
5.0000 mg | INHALATION_SOLUTION | Freq: Once | RESPIRATORY_TRACT | Status: AC
  Administered 2012-12-04: 5 mg via RESPIRATORY_TRACT
  Filled 2012-12-04: qty 1

## 2012-12-04 MED ORDER — AZITHROMYCIN 250 MG PO TABS
500.0000 mg | ORAL_TABLET | Freq: Once | ORAL | Status: AC
  Administered 2012-12-04: 500 mg via ORAL
  Filled 2012-12-04: qty 2

## 2012-12-04 MED ORDER — SODIUM CHLORIDE 0.9 % IV SOLN: INTRAVENOUS | Status: AC

## 2012-12-04 MED ORDER — GUAIFENESIN 100 MG/5ML PO SOLN
5.0000 mL | ORAL | Status: DC | PRN
  Filled 2012-12-04: qty 5

## 2012-12-04 MED ORDER — LEVOFLOXACIN IN D5W 750 MG/150ML IV SOLN
750.0000 mg | INTRAVENOUS | Status: DC
  Administered 2012-12-04 – 2012-12-05 (×2): 750 mg via INTRAVENOUS
  Filled 2012-12-04 (×3): qty 150

## 2012-12-04 MED ORDER — IPRATROPIUM BROMIDE 0.02 % IN SOLN
0.5000 mg | Freq: Once | RESPIRATORY_TRACT | Status: AC
  Administered 2012-12-04: 0.5 mg via RESPIRATORY_TRACT
  Filled 2012-12-04: qty 2.5

## 2012-12-04 NOTE — ED Notes (Signed)
Family at bedside. 

## 2012-12-04 NOTE — ED Notes (Signed)
Spoke with Jennette Dubin [facility] about secondary pt complaint of vaginal bleed. States will do vaginal [place] tomorrow.

## 2012-12-04 NOTE — ED Notes (Signed)
Pt not getting discharge yet.

## 2012-12-04 NOTE — ED Notes (Signed)
Pt. Coughing and having vaginal bleeding for 2 weeks. Denis any n/v/d chest pain when she is coughing . Lower back pain.

## 2012-12-04 NOTE — ED Notes (Signed)
Floor called for report, receiving RN Lanora Manis will call back to get report.

## 2012-12-04 NOTE — ED Notes (Signed)
Receiving RN Lanora Manis notified that per admitting orders pt need to be on droplet isolation for flu and meningitis.

## 2012-12-04 NOTE — ED Provider Notes (Signed)
History     CSN: [id number]  Arrival date & time 12/04/12  1248   First MD Initiated Contact with Patient 12/04/12 1440      Chief Complaint  Patient presents with  . Cough    (Consider location/radiation/quality/duration/timing/severity/associated sxs/prior treatment) HPI....Marland Kitchen cough for 8 days. No fever, sweats, chills, rusty sputum. Nothing makes symptoms better or worse. Severity is moderate. No radiation of pain.  History reviewed. No pertinent past medical history.  Past Surgical History  Procedure Laterality Date  . Cesarean section  1995    No family history on file.  History  Substance Use Topics  . Smoking status: Never Smoker   . Smokeless tobacco: Not on file  . Alcohol Use: No    OB History   Grav Para Term Preterm Abortions TAB SAB Ect Mult Living                  Review of Systems  All other systems reviewed and are negative.    Allergies  Review of patient's allergies indicates no known allergies.  Home Medications   Current Outpatient Rx  Name  Route  Sig  Dispense  Refill  . albuterol (PROVENTIL HFA;VENTOLIN HFA) 108 (90 BASE) MCG/ACT inhaler   Inhalation   Inhale 1-2 puffs into the lungs every 6 (six) hours as needed for wheezing.   1 Inhaler   0   . azithromycin (ZITHROMAX Z-PAK) 250 MG tablet      2 po day one, then 1 daily x 4 days   5 tablet   0     BP 113/60  Pulse 72  Temp(Src) 98.7 F (37.1 C) (Oral)  Resp 17  SpO2 100%  LMP 11/20/2012  Physical Exam  Nursing note and vitals reviewed. Constitutional: She is oriented to person, place, and time. She appears well-developed and well-nourished.  No dyspnea  HENT:  Head: Normocephalic and atraumatic.  Eyes: Conjunctivae and EOM are normal. Pupils are equal, round, and reactive to light.  Neck: Normal range of motion. Neck supple.  Cardiovascular: Normal rate, regular rhythm and normal heart sounds.   Pulmonary/Chest: Effort normal and breath sounds normal.   Abdominal: Soft. Bowel sounds are normal.  Musculoskeletal: Normal range of motion.  Neurological: She is alert and oriented to person, place, and time.  Skin: Skin is warm and dry.  Psychiatric: She has a normal mood and affect.    ED Course  Procedures (including critical care time)  Labs Reviewed  CBC WITH DIFFERENTIAL - Abnormal; Notable for the following:    Platelets 145 (*)    All other components within normal limits  POCT I-STAT, CHEM 8   Dg Chest 2 View  12/04/2012  *RADIOLOGY REPORT*  Clinical Data: Cough, chest pain and weakness.  CHEST - 2 VIEW  Comparison: 08/21/2012 radiograph  Findings: Left lower lobe airspace disease is compatible with pneumonia. The cardiomediastinal silhouette is unremarkable. The right lung is clear. There is no evidence of pleural effusion, pulmonary edema or pneumothorax. No acute bony abnormalities are identified.  IMPRESSION: Left lower lobe pneumonia - radiographic follow up to resolution recommended.   Original Report Authenticated By: Harmon Pier, M.D.      1. Community acquired pneumonia       MDM  Patient is oxygenating at 100% without nasal oxygen. We'll start IV Rocephin and by mouth Zithromax secondary to shortage of intravenous Zithromax.  Patient is hemodynamically stable. Discharge meds include Zithromax by mouth for 5 days and albuterol inhaler. Discussed  with patient and her son        Donnetta Hutching, MD 12/04/12 [phone]

## 2012-12-04 NOTE — H&P (Signed)
PATIENT DETAILS Name: [name] Age: 47 y.o. Sex: female Date of Birth: 11-07-1965 Admit Date: 12/04/2012 WUJ:[id number], Provider, MD   CHIEF COMPLAINT:  Persistent with gentle bleeding for the past 2 weeks Cough with productive phlegm for past 8 days  HPI: Patient is a 47 year old African female from [place] with no significant medical issues-not on any medications at home-presented to the ED for evaluation of cough and productive sputum for the past 8 days. She also claims that for the past 2 weeks-she has continued to have a vaginal bleeding (has ongoing menstrual period), normally her periods last approximately a week. Per patient, her periods usually last for approximately 7 days, however this current one has been going on for 2 weeks, she claims that the bleeding is heavy with passage of some clots. She also claims that for the past week or so, she has been having cough with productive phlegm. Sputum is white in color with no blood. She also has been having subjective fevers-with chills and sweats at home. She denies any chest pain. She claims that she has exertional dyspnea and gets very tired even if she walks small distances. She claims that she has been sleeping on a chair for the past few days. She denies any leg swelling. She was evaluated in the emergency room, a chest x-ray was positive for pneumonia, she was given antibiotics and was being discharged, however she was just not feeling well, and kept on having exertional dyspnea. I was subsequently called to admit this patient for further evaluation and treatment. During my evaluation, she was not dyspneic at rest, O2 saturations were in the high 90s, she was not tachycardic. She appeared slightly anxious. She denied any ongoing chest pain, abdominal pain or any nausea vomiting or diarrhea. She denied any headaches. She is able to speak some English and get her point across, her son (who speaks perfect English) was at bedside and was  helping out with the history as well.   ALLERGIES:  No Known Allergies  PAST MEDICAL HISTORY: History reviewed. No pertinent past medical history.  PAST SURGICAL HISTORY: Past Surgical History  Procedure Laterality Date  . Cesarean section  1995    MEDICATIONS AT HOME: Prior to Admission medications   Medication Sig Start Date End Date Taking? Authorizing Provider  albuterol (PROVENTIL HFA;VENTOLIN HFA) 108 (90 BASE) MCG/ACT inhaler Inhale 1-2 puffs into the lungs every 6 (six) hours as needed for wheezing. 12/04/12   Donnetta Hutching, MD  azithromycin (ZITHROMAX Z-PAK) 250 MG tablet 2 po day one, then 1 daily x 4 days 12/04/12   Donnetta Hutching, MD    FAMILY HISTORY: No family history of coronary artery disease  SOCIAL HISTORY:  reports that she has never smoked. She does not have any smokeless tobacco history on file. She reports that she does not drink alcohol or use illicit drugs.  REVIEW OF SYSTEMS:  Constitutional:   No  weight loss, night sweats,  Fevers, chills, fatigue.  HEENT:    No headaches, Difficulty swallowing,Tooth/dental problems,Sore throat,  No sneezing, itching, ear ache, nasal congestion, post nasal drip,   Cardio-vascular: No chest pain,  swelling in lower extremities, anasarca, dizziness, palpitations  GI:  No heartburn, indigestion, abdominal pain, nausea, vomiting, diarrhea, change in  bowel habits, loss of appetite  Resp: No shortness of breath with exertion or at rest.  No excess mucus, no productive cough, No non-productive cough,  No coughing up of blood.No change in color of mucus.No wheezing.No chest wall deformity  Skin:  no rash or lesions.  GU:  no dysuria, change in color of urine, no urgency or frequency.  No flank pain.  Musculoskeletal: No joint pain or swelling.  No decreased range of motion.  No back pain.  Psych: No change in mood or affect. No depression or anxiety.  No memory loss.   PHYSICAL EXAM: Blood pressure 112/63, pulse  80, temperature 98.7 F (37.1 C), temperature source Oral, resp. rate 26, last menstrual period 11/20/2012, SpO2 100.00%.  General appearance :Awake, alert, not in any distress. Speech Clear. Not toxic Looking HEENT: Atraumatic and Normocephalic, pupils equally reactive to light and accomodation Neck: supple,  No cervical lymphadenopathy.  Chest:Good air entry bilaterally, has bibasilar rales. CVS: S1 S2 regular, no murmurs.  Abdomen: Bowel sounds present, Non tender and not distended with no gaurding, rigidity or rebound. Extremities: B/L Lower Ext shows no edema, both legs are warm to touch Neurology: Awake alert, and oriented X 3, CN II-XII intact, Non focal Skin:No Rash Wounds:N/A  LABS ON ADMISSION:   Recent Labs  12/04/12 1455  NA 141  K 3.6  CL 106  GLUCOSE 96  BUN 7  CREATININE 0.90   No results found for this basename: AST, ALT, ALKPHOS, BILITOT, PROT, ALBUMIN,  in the last 72 hours No results found for this basename: LIPASE, AMYLASE,  in the last 72 hours  Recent Labs  12/04/12 1431 12/04/12 1455  WBC 5.9  --   NEUTROABS 3.2  --   HGB 12.4 13.3  HCT 36.6 39.0  MCV 79.6  --   PLT 145*  --    No results found for this basename: CKTOTAL, CKMB, CKMBINDEX, TROPONINI,  in the last 72 hours No results found for this basename: DDIMER,  in the last 72 hours No components found with this basename: POCBNP,    RADIOLOGIC STUDIES ON ADMISSION: Dg Chest 2 View  12/04/2012  *RADIOLOGY REPORT*  Clinical Data: Cough, chest pain and weakness.  CHEST - 2 VIEW  Comparison: 08/21/2012 radiograph  Findings: Left lower lobe airspace disease is compatible with pneumonia. The cardiomediastinal silhouette is unremarkable. The right lung is clear. There is no evidence of pleural effusion, pulmonary edema or pneumothorax. No acute bony abnormalities are identified.  IMPRESSION: Left lower lobe pneumonia - radiographic follow up to resolution recommended.   Original Report Authenticated  By: Harmon Pier, M.D.     ASSESSMENT AND PLAN: Present on Admission:  . PNA (pneumonia) - Likely community acquired pneumonia  - Start Levaquin  - Other supportive care - Check HIV, urine for Legionella and streptococcal antigen  Shortness of breath - This appears to be mostly exertional- this appears out of proportion to her radiological findings on chest x-ray. - Will check BNP, echo and cycle cardiac enzymes - We'll monitor closely, if need be we can do further investigations-but would like to wait for the above investigations to come in first - For now, placed on nebulized bronchodilators- and reassess in a.m.  . Menorrhagia - Apparently her current menstrual period his ongoing for 2 weeks- apparently this has happened once in the past-patient is not able to tell me what exactly was going on then - Check pelvic and transvaginal ultrasound - Hemoglobin is stable, suspect she will need followup at the [facility] clinic as an outpatient  Further plan will depend as patient's clinical course evolves and further radiologic and laboratory data become available. Patient will be monitored closely.  DVT Prophylaxis: - SCDs for now  Code  Status: - Full code  Total time spent for admission equals 45 minutes.  [facility] Pager [phone]  If 7PM-7AM, please contact night-coverage [email or web address] Password [facility] 12/04/2012, 7:14 PM

## 2012-12-05 ENCOUNTER — Inpatient Hospital Stay (HOSPITAL_COMMUNITY): Payer: Medicaid Other

## 2012-12-05 ENCOUNTER — Observation Stay (HOSPITAL_COMMUNITY): Payer: Medicaid Other

## 2012-12-05 DIAGNOSIS — I519 Heart disease, unspecified: Secondary | ICD-10-CM

## 2012-12-05 DIAGNOSIS — D649 Anemia, unspecified: Secondary | ICD-10-CM | POA: Diagnosis present

## 2012-12-05 DIAGNOSIS — M7989 Other specified soft tissue disorders: Secondary | ICD-10-CM

## 2012-12-05 LAB — CBC WITH DIFFERENTIAL/PLATELET
Band Neutrophils: 0 % (ref 0–10)
Blasts: 0 %
Eosinophils Absolute: 0 10*3/uL (ref 0.0–0.7)
Eosinophils Relative: 0 % (ref 0–5)
HCT: 31.3 % — ABNORMAL LOW (ref 36.0–46.0)
Hemoglobin: 10.8 g/dL — ABNORMAL LOW (ref 12.0–15.0)
Lymphocytes Relative: 47 % — ABNORMAL HIGH (ref 12–46)
Lymphs Abs: 1.8 10*3/uL (ref 0.7–4.0)
Monocytes Absolute: 0.3 10*3/uL (ref 0.1–1.0)
Monocytes Relative: 7 % (ref 3–12)
RBC: 4.04 MIL/uL (ref 3.87–5.11)
RDW: 13 % (ref 11.5–15.5)
WBC: 3.9 10*3/uL — ABNORMAL LOW (ref 4.0–10.5)

## 2012-12-05 LAB — COMPREHENSIVE METABOLIC PANEL
AST: 23 U/L (ref 0–37)
CO2: 25 mEq/L (ref 19–32)
Calcium: 8.2 mg/dL — ABNORMAL LOW (ref 8.4–10.5)
Creatinine, Ser: 0.87 mg/dL (ref 0.50–1.10)
GFR calc Af Amer: 89 mL/min — ABNORMAL LOW (ref >90)
GFR calc non Af Amer: 77 mL/min — ABNORMAL LOW (ref >90)
Glucose, Bld: 142 mg/dL — ABNORMAL HIGH (ref 70–99)

## 2012-12-05 LAB — EXPECTORATED SPUTUM ASSESSMENT W GRAM STAIN, RFLX TO RESP C

## 2012-12-05 LAB — TROPONIN I
Troponin I: <0.3 ng/mL (ref <0.30)
Troponin I: <0.3 ng/mL (ref <0.30)

## 2012-12-05 LAB — HIV ANTIBODY (ROUTINE TESTING W REFLEX): HIV: NONREACTIVE

## 2012-12-05 MED ORDER — SODIUM CHLORIDE 0.9 % IV SOLN
INTRAVENOUS | Status: DC
  Administered 2012-12-05 – 2012-12-06 (×2): via INTRAVENOUS

## 2012-12-05 MED ORDER — ALUM & MAG HYDROXIDE-SIMETH 200-200-20 MG/5ML PO SUSP
30.0000 mL | Freq: Once | ORAL | Status: AC
  Administered 2012-12-05: 30 mL via ORAL
  Filled 2012-12-05: qty 30

## 2012-12-05 MED ORDER — FERROUS SULFATE 325 (65 FE) MG PO TABS
325.0000 mg | ORAL_TABLET | Freq: Two times a day (BID) | ORAL | Status: DC
  Administered 2012-12-05 – 2012-12-06 (×2): 325 mg via ORAL
  Filled 2012-12-05 (×5): qty 1

## 2012-12-05 MED ORDER — MEGESTROL ACETATE 20 MG PO TABS
10.0000 mg | ORAL_TABLET | Freq: Every day | ORAL | Status: DC
  Administered 2012-12-05 – 2012-12-06 (×2): 10 mg via ORAL
  Filled 2012-12-05 (×2): qty 0.5

## 2012-12-05 MED ORDER — DOCUSATE SODIUM 100 MG PO CAPS
100.0000 mg | ORAL_CAPSULE | Freq: Two times a day (BID) | ORAL | Status: DC
  Administered 2012-12-05 – 2012-12-06 (×3): 100 mg via ORAL
  Filled 2012-12-05 (×4): qty 1

## 2012-12-05 MED ORDER — ZOLPIDEM TARTRATE 5 MG PO TABS
5.0000 mg | ORAL_TABLET | Freq: Once | ORAL | Status: AC
  Administered 2012-12-05: 5 mg via ORAL
  Filled 2012-12-05: qty 1

## 2012-12-05 MED ORDER — ALBUTEROL SULFATE (5 MG/ML) 0.5% IN NEBU
2.5000 mg | INHALATION_SOLUTION | Freq: Four times a day (QID) | RESPIRATORY_TRACT | Status: DC
  Administered 2012-12-05 – 2012-12-06 (×4): 2.5 mg via RESPIRATORY_TRACT
  Filled 2012-12-05 (×6): qty 0.5

## 2012-12-05 NOTE — Progress Notes (Signed)
  Echocardiogram 2D Echocardiogram has been performed.  [name] 12/05/2012, 11:10 AM

## 2012-12-05 NOTE — Progress Notes (Signed)
Pt arrived to unit from ED. Pt is A&O, VS stable, and skin intact. Family is currently at bedside and pt is resting comfortably in bed.

## 2012-12-05 NOTE — Progress Notes (Signed)
[facility] PROGRESS NOTE  Sani Loiseau ZOX:[id number] DOB: Apr 22, 1966 DOA: 12/04/2012 PCP: Default, Provider, MD  Assessment/Plan:  C. A. Pneumonia in left lower lobe Afebrile, without elevated WBC HIV negative, Influenza PCR negative Sputum culture with WBC, few gm+ cocci, rare gm- rods Levaquin started at admission  SOB Dizzy when ambulating Seems out of proportion to her other findings  2D echo shows grade 1 diastolic dysfunction Troponin X 3 WNL Venous Doppler of Lower extremities was negative Repeating CXR.  2 view. Checking orthostatic vital signs Requesting PT Evaluation.  Prolonged Vaginal Bleeding Fibroids confirmed on Vaginal/Pelvic Ultrasound Discuss with Dr. Marice Potter of GYN.  Started Megace 10 mg tab daily  Will follow up with the [facility], Dr. Erin Fulling on 3/26.  Microcytic Anemia Hgb 10.8, MCV 77.5 Secondary to menorrhagia Started on iron supplementation and colace.  Code Status: full code Family Communication: Son,  Kateri Mc and Aunt at bedside Disposition Plan: to home likely 3/21.   HPI/Subjective: Complaining of insomnia and shortness of breath.  Objective: Filed Vitals:   12/05/12 0553 12/05/12 0844 12/05/12 1416 12/05/12 1435  BP: 130/5  124/72   Pulse: 69  76   Temp: 98.1 F (36.7 C)  98.3 F (36.8 C)   TempSrc: Oral  Oral   Resp: 18  18   Height:      Weight:      SpO2: 99% 97% 99% 100%    Intake/Output Summary (Last 24 hours) at 12/05/12 1508 Last data filed at 12/05/12 1300  Gross per 24 hour  Intake 526.67 ml  Output    600 ml  Net -73.33 ml   Filed Weights   12/04/12 2158  Weight: 80.7 kg (177 lb 14.6 oz)    Exam: General appearance : WD, Overweight, Female,  Standing appears dizzy.  Speech Clear.  Speaks broken english HEENT: Atraumatic and Normocephalic, pupils equally reactive to light and accomodation  Neck: supple, No cervical lymphadenopathy.  Chest:Good air entry bilaterally, CTA, but with non  productive cough. CVS: S1 S2 regular, no murmurs.  Abdomen: Bowel sounds present, Non tender and not distended with no gaurding, rigidity or rebound.  Extremities: B/L Lower Ext shows no edema, both legs are warm to touch       Data Reviewed: Basic Metabolic Panel:  Recent Labs Lab 12/04/12 1455 12/05/12 0247  NA 141 139  K 3.6 3.6  CL 106 105  CO2  --  25  GLUCOSE 96 142*  BUN 7 10  CREATININE 0.90 0.87  CALCIUM  --  8.2*   Liver Function Tests:  Recent Labs Lab 12/05/12 0247  AST 23  ALT 14  ALKPHOS 43  BILITOT 0.2*  PROT 6.7  ALBUMIN 2.8*   CBC:  Recent Labs Lab 12/04/12 1431 12/04/12 1455 12/05/12 0247  WBC 5.9  --  3.9*  NEUTROABS 3.2  --  1.8  HGB 12.4 13.3 10.8*  HCT 36.6 39.0 31.3*  MCV 79.6  --  77.5*  PLT 145*  --  156   Cardiac Enzymes:  Recent Labs Lab 12/04/12 1904 12/04/12 2136 12/05/12 0247 12/05/12 0849  TROPONINI <0.30 <0.30 <0.30 <0.30   BNP (last 3 results)  Recent Labs  12/04/12 1904 12/04/12 2136  PROBNP 17.5 17.3   CBG: No results found for this basename: GLUCAP,  in the last 168 hours  Recent Results (from the past 240 hour(s))  CULTURE, EXPECTORATED SPUTUM-ASSESSMENT     Status: None   Collection Time    12/05/12 12:01 AM  Result Value Range Status   Specimen Description SPUTUM   Final   Special Requests NONE   Final   Sputum evaluation     Final   Value: THIS SPECIMEN IS ACCEPTABLE. RESPIRATORY CULTURE REPORT TO FOLLOW.   Report Status 12/05/2012 FINAL   Final  CULTURE, RESPIRATORY (NON-EXPECTORATED)     Status: None   Collection Time    12/05/12 12:01 AM      Result Value Range Status   Specimen Description SPUTUM   Final   Special Requests NONE   Final   Gram Stain     Final   Value: MODERATE WBC PRESENT,BOTH PMN AND MONONUCLEAR     FEW SQUAMOUS EPITHELIAL CELLS PRESENT     FEW GRAM POSITIVE COCCI     IN PAIRS RARE GRAM NEGATIVE RODS   Culture PENDING   Incomplete   Report Status PENDING    Incomplete     Studies: Dg Chest 2 View  12/04/2012  *RADIOLOGY REPORT*  Clinical Data: Cough, chest pain and weakness.  CHEST - 2 VIEW  Comparison: 08/21/2012 radiograph  Findings: Left lower lobe airspace disease is compatible with pneumonia. The cardiomediastinal silhouette is unremarkable. The right lung is clear. There is no evidence of pleural effusion, pulmonary edema or pneumothorax. No acute bony abnormalities are identified.  IMPRESSION: Left lower lobe pneumonia - radiographic follow up to resolution recommended.   Original Report Authenticated By: Harmon Pier, M.D.    US Transvaginal Non-ob  12/05/2012  *RADIOLOGY REPORT*  Clinical Data: Menorrhagia.  LMP 11/21/2012  TRANSABDOMINAL AND TRANSVAGINAL ULTRASOUND OF PELVIS Technique:  Both transabdominal and transvaginal ultrasound examinations of the pelvis were performed. Transabdominal technique was performed for global imaging of the pelvis including uterus, ovaries, adnexal regions, and pelvic cul-de-sac.  It was necessary to proceed with endovaginal exam following the transabdominal exam to visualize the cervix.  Comparison:  2007 and CT 2012  Findings:  Uterus: Is anteverted and anteflexed and assessed well only transabdominally due to uterine size resulting in poor visibility on endovaginal exam.  The uterus demonstrates a sagittal length of approximately 16 cm, depth of approximately 6 cm and width of approximately 10 cm.  One partial subserosal fibroid is identified associated with the anterior left upper uterine segment measuring 5.8 x 4.5 x 4.1 cm and a second partial subserosal fibroid is seen emanating off of the fundal region measuring 2.8 x 1.9 x 2.5 cm. The remainder of the myometrium is poorly evaluated and more diffuse fibroid involvement may be present.  Endometrium: Is not adequately assessed either transabdominally or endovaginally with this study  Right ovary:  In the right adnexa there is a thin-walled cystic lesion identified  measuring 3.4 x 2.3 by 3.9 cm.  A similar appearance was noted in the right adnexa on prior CT from 2012 and this may represent a benign neoplastic process or hydrosalpinx.  No worrisome features are suggested transabdominally although the assessment is slightly as it is seen only with the transabdominal approach.  Left ovary: Measures 2.4 x 1.5 x 1.9 cm and has a normal transabdominal appearance. This ovary is not seen endovaginally.  Other findings: No pelvic fluid is seen  IMPRESSION: Enlarged uterus with overall exam compromised due to the large uterine size resulting in poor endovaginal evaluation.  At least two measurable fibroids are seen transabdominally.  Right adnexal cystic lesion with no obvious worrisome features and similar in appearance to findings in the right adnexa on prior CT from 2012. If this is  a persistent lesion from 2012 this would suggest a benign etiology.  Alternatively this could be new functional lesion on this side in this premenopausal patient.  Given the overall poor visibility of the uterus endovaginally and history of menorrhagia, if more detailed evaluation of the uterus and the endometrium is desired, pelvic MRI could be performed .   Original Report Authenticated By: Rhodia Albright, M.D.    US Pelvis Complete  12/05/2012  *RADIOLOGY REPORT*  Clinical Data: Menorrhagia.  LMP 11/21/2012  TRANSABDOMINAL AND TRANSVAGINAL ULTRASOUND OF PELVIS Technique:  Both transabdominal and transvaginal ultrasound examinations of the pelvis were performed. Transabdominal technique was performed for global imaging of the pelvis including uterus, ovaries, adnexal regions, and pelvic cul-de-sac.  It was necessary to proceed with endovaginal exam following the transabdominal exam to visualize the cervix.  Comparison:  2007 and CT 2012  Findings:  Uterus: Is anteverted and anteflexed and assessed well only transabdominally due to uterine size resulting in poor visibility on endovaginal exam.  The  uterus demonstrates a sagittal length of approximately 16 cm, depth of approximately 6 cm and width of approximately 10 cm.  One partial subserosal fibroid is identified associated with the anterior left upper uterine segment measuring 5.8 x 4.5 x 4.1 cm and a second partial subserosal fibroid is seen emanating off of the fundal region measuring 2.8 x 1.9 x 2.5 cm. The remainder of the myometrium is poorly evaluated and more diffuse fibroid involvement may be present.  Endometrium: Is not adequately assessed either transabdominally or endovaginally with this study  Right ovary:  In the right adnexa there is a thin-walled cystic lesion identified measuring 3.4 x 2.3 by 3.9 cm.  A similar appearance was noted in the right adnexa on prior CT from 2012 and this may represent a benign neoplastic process or hydrosalpinx.  No worrisome features are suggested transabdominally although the assessment is slightly as it is seen only with the transabdominal approach.  Left ovary: Measures 2.4 x 1.5 x 1.9 cm and has a normal transabdominal appearance. This ovary is not seen endovaginally.  Other findings: No pelvic fluid is seen  IMPRESSION: Enlarged uterus with overall exam compromised due to the large uterine size resulting in poor endovaginal evaluation.  At least two measurable fibroids are seen transabdominally.  Right adnexal cystic lesion with no obvious worrisome features and similar in appearance to findings in the right adnexa on prior CT from 2012. If this is a persistent lesion from 2012 this would suggest a benign etiology.  Alternatively this could be new functional lesion on this side in this premenopausal patient.  Given the overall poor visibility of the uterus endovaginally and history of menorrhagia, if more detailed evaluation of the uterus and the endometrium is desired, pelvic MRI could be performed .   Original Report Authenticated By: Rhodia Albright, M.D.     Scheduled Meds: . sodium chloride    Intravenous STAT  . albuterol  2.5 mg Nebulization QID  . ferrous sulfate  325 mg Oral BID WC  . levofloxacin (LEVAQUIN) IV  750 mg Intravenous Q24H  . megestrol  10 mg Oral Daily   Continuous Infusions:   Principal Problem:   PNA (pneumonia) Active Problems:   Menorrhagia   Anemia     Conley Canal  [facility] Pager [phone]. If 7PM-7AM, please contact night-coverage at [email or web address], password [facility] 12/05/2012, 3:08 PM  LOS: 1 day    Attending  Patient seen and examined, agree with the assessment and  plan. Seems to be better today compared to yesterday, spoke with Dr Marice Potter GYN on call-recommended Megace after discussion of ultrasound pelvis results.  Echo shows normal LV, Doppler of legs negative. She claims to be SOB upon exertions-her symptoms seem disproportionate to the XRAY chest findings of PNA. Recheck CXR today-hopefully with continued IV Abx she will make further improvement and can be discharged home tomorrow  S Ghimire

## 2012-12-05 NOTE — Progress Notes (Signed)
VASCULAR LAB PRELIMINARY  PRELIMINARY  PRELIMINARY  PRELIMINARY  Bilateral lower extremity venous Dopplers completed.    Preliminary report:  There is no DVT or SVT noted in the bilateral lower extremities.  Yaiza Palazzola, RVT 12/05/2012, 11:30 AM

## 2012-12-06 DIAGNOSIS — N920 Excessive and frequent menstruation with regular cycle: Secondary | ICD-10-CM

## 2012-12-06 DIAGNOSIS — J189 Pneumonia, unspecified organism: Secondary | ICD-10-CM

## 2012-12-06 DIAGNOSIS — D649 Anemia, unspecified: Secondary | ICD-10-CM

## 2012-12-06 LAB — CBC
Hemoglobin: 11.5 g/dL — ABNORMAL LOW (ref 12.0–15.0)
MCH: 26.3 pg (ref 26.0–34.0)
MCV: 77.6 fL — ABNORMAL LOW (ref 78.0–100.0)
RBC: 4.38 MIL/uL (ref 3.87–5.11)

## 2012-12-06 LAB — URINALYSIS, ROUTINE W REFLEX MICROSCOPIC
Bilirubin Urine: NEGATIVE
Specific Gravity, Urine: 1.016 (ref 1.005–1.030)
Urobilinogen, UA: 0.2 mg/dL (ref 0.0–1.0)
pH: 7 (ref 5.0–8.0)

## 2012-12-06 LAB — BASIC METABOLIC PANEL
BUN: 9 mg/dL (ref 6–23)
CO2: 25 mEq/L (ref 19–32)
Calcium: 9 mg/dL (ref 8.4–10.5)
Creatinine, Ser: 0.8 mg/dL (ref 0.50–1.10)
Glucose, Bld: 108 mg/dL — ABNORMAL HIGH (ref 70–99)

## 2012-12-06 LAB — STREP PNEUMONIAE URINARY ANTIGEN: Strep Pneumo Urinary Antigen: NEGATIVE

## 2012-12-06 LAB — URINE MICROSCOPIC-ADD ON

## 2012-12-06 MED ORDER — MEGESTROL ACETATE 20 MG PO TABS
10.0000 mg | ORAL_TABLET | Freq: Every day | ORAL | Status: DC
Start: 2012-12-06 — End: 2012-12-11

## 2012-12-06 MED ORDER — FERROUS SULFATE 325 (65 FE) MG PO TABS: 325.0000 mg | ORAL_TABLET | Freq: Two times a day (BID) | ORAL | Status: DC

## 2012-12-06 MED ORDER — LEVOFLOXACIN 750 MG PO TABS: 750.0000 mg | ORAL_TABLET | Freq: Every day | ORAL | Status: DC

## 2012-12-06 MED ORDER — DSS 100 MG PO CAPS: 100.0000 mg | ORAL_CAPSULE | Freq: Two times a day (BID) | ORAL | Status: DC

## 2012-12-06 NOTE — Progress Notes (Signed)
[facility]  Patient Status: New  [facility] is providing the following services: RN  If patient discharges after hours, please call [phone].   [name] 12/06/2012, 2:54 PM

## 2012-12-06 NOTE — Evaluation (Signed)
Physical Therapy Evaluation Patient Details Name: [name] MRN: [id number] DOB: 1965-10-24 Today's Date: 12/06/2012 Time: 1352-1410 PT Time Calculation (min): 18 min  PT Assessment / Plan / Recommendation Clinical Impression  Pt adm with PNA.  Pt with adequate mobility to return home.  No balance deficits noted. Pt moving slowly.  Pt with very flat affect and limited interaction. No further PT needed.    PT Assessment  Patent does not need any further PT services    Follow Up Recommendations  No PT follow up    Does the patient have the potential to tolerate intense rehabilitation      Barriers to Discharge        Equipment Recommendations  None recommended by PT    Recommendations for Other Services     Frequency      Precautions / Restrictions Precautions Precautions: None   Pertinent Vitals/Pain No signs of pain.      Mobility  Bed Mobility Bed Mobility: Supine to Sit;Sitting - Scoot to Edge of Bed Supine to Sit: 6: Modified independent (Device/Increase time) Sitting - Scoot to Edge of Bed: 6: Modified independent (Device/Increase time) Details for Bed Mobility Assistance: Incr time Transfers Transfers: Sit to Stand;Stand to Sit Sit to Stand: 6: Modified independent (Device/Increase time);With upper extremity assist;From bed Stand to Sit: 6: Modified independent (Device/Increase time);With upper extremity assist;To bed;To chair/3-in-1;With armrests;To toilet Details for Transfer Assistance: Incr time. Ambulation/Gait Ambulation/Gait Assistance: 6: Modified independent (Device/Increase time) Ambulation Distance (Feet): 225 Feet Assistive device: None;Rolling walker Gait Pattern: Step-through pattern;Decreased stride length;Wide base of support Gait velocity: very slow General Gait Details: All movements appear laborious.    Exercises     PT Diagnosis:    PT Problem List:   PT Treatment Interventions:     PT Goals    Visit Information  Last PT  Received On: 12/06/12 Assistance Needed: +1    Subjective Data  Subjective: Pt states she needs to go to the bathroom. Patient Stated Goal: Pt didn't state.   Prior Functioning  Home Living Lives With: Family Available Help at Discharge: Available 24 hours/day;Family Type of Home: House Home Layout: Two level Home Adaptive Equipment: None Prior Function Level of Independence: Independent Able to Take Stairs?: Yes    Cognition  Cognition Overall Cognitive Status: Appears within functional limits for tasks assessed/performed Behavior During Session: Flat affect Cognition - Other Comments: Pt with flat affect and reluctant to engage in any interaction.    Extremity/Trunk Assessment Right Lower Extremity Assessment RLE ROM/Strength/Tone: WFL for tasks assessed Left Lower Extremity Assessment LLE ROM/Strength/Tone: WFL for tasks assessed   Balance Balance Balance Assessed: Yes Dynamic Standing Balance Dynamic Standing - Balance Support: No upper extremity supported;During functional activity Dynamic Standing - Level of Assistance: 7: Independent  End of Session PT - End of Session Activity Tolerance: Patient tolerated treatment well Patient left: in chair;with family/visitor present Nurse Communication: Mobility status  GP     Averi Cacioppo 12/06/2012, 2:28 PM  [facility] PT [phone]

## 2012-12-06 NOTE — Care Management Note (Signed)
    Page 1 of 2   12/06/2012     3:52:33 PM   CARE MANAGEMENT NOTE 12/06/2012  Patient:  DESA, RECH   Account Number:  [id number]  Date Initiated:  12/06/2012  Documentation initiated by:  Letha Cape  Subjective/Objective Assessment:   dx pna  admit- lives with son.     Action/Plan:   Anticipated DC Date:  12/06/2012   Anticipated DC Plan:  HOME W HOME HEALTH SERVICES      DC Planning Services  CM consult  MATCH Program  Follow-up appt scheduled      [facility]ain[facility] Guerrieri [facility] Choice  HOME HEALTH   Choice offered to / List presented to:  C-1 Patient        HH arranged  HH-1 RN  HH-4 NURSE'S AIDE      HH agency  [facility]   Status of service:  Completed, signed off Medicare Important Message given?   (If response is "NO", the following Medicare IM given date fields will be blank) Date Medicare IM given:   Date Additional Medicare IM given:    Discharge Disposition:  HOME W HOME HEALTH SERVICES  Per UR Regulation:  Reviewed for med. necessity/level of care/duration of stay  If discussed at Long Length of Stay Meetings, dates discussed:    Comments:  12/06/12 14:40 Letha Cape RN, BSN [phone] pateint lives with son, patient is for dc today, patient needs ast with medications, asst with the Match Program. Patient has transportation at dc.  Patient chose [facility] for [facility] , referral given to [facility], Lupita Leash notified for [facility].  Soc will begin 24-48 hrs post discharge.

## 2012-12-06 NOTE — Discharge Summary (Signed)
Physician Discharge Summary  [name] ZOX:[id number] DOB: 1966/02/29 DOA: 12/04/2012  PCP: Default, Provider, MD  Admit date: 12/04/2012 Discharge date: 12/06/2012  Time spent: 45 minutes  Recommendations for Outpatient Follow-up:  Follow up with GYN on 3/26 for vaginal bleeding. Follow up with PCP in 1-2 weeks to ensure resolution of Pneumonia.   Discharge Diagnoses:  Principal Problem:   PNA (pneumonia) Active Problems:   Menorrhagia   Anemia   Discharge Condition: stable, still becomes short of breath when walking.  Diet recommendation: low sodium, heart healthy  Filed Weights   12/04/12 2158  Weight: 80.7 kg (177 lb 14.6 oz)    History of present illness at the time of admission:  Patient is a 47 year old African female from [place] with no significant medical issues-not on any medications at home-presented to the ED for evaluation of cough and productive sputum for the past 8 days. She also claims that for the past 2 weeks-she has continued to have a vaginal bleeding (has ongoing menstrual period), normally her periods last approximately a week. Per patient, her periods usually last for approximately 7 days, however this current one has been going on for 2 weeks, she claims that the bleeding is heavy with passage of some clots. She also claims that for the past week or so, she has been having cough with productive phlegm. Sputum is white in color with no blood. She also has been having subjective fevers-with chills and sweats at home. She denies any chest pain. She claims that she has exertional dyspnea and gets very tired even if she walks small distances. She claims that she has been sleeping on a chair for the past few days. She denies any leg swelling. She was evaluated in the emergency room, a chest x-ray was positive for pneumonia, she was given antibiotics and was being discharged, however she was just not feeling well, and kept on having exertional dyspnea. I was  subsequently called to admit this patient for further evaluation and treatment.  During my evaluation, she was not dyspneic at rest, O2 saturations were in the high 90s, she was not tachycardic. She appeared slightly anxious. She denied any ongoing chest pain, abdominal pain or any nausea vomiting or diarrhea. She denied any headaches.  She is able to speak some English and get her point across, her son (who speaks perfect English) was at bedside and was helping out with the history.     Hospital Course:   C. A. Pneumonia in left lower lobe  Visible on chest xray.  Afebrile, without elevated WBC.  HIV negative, Influenza PCR negative.  Sputum culture with WBC, few gm+ cocci, rare gm- rods.  Levaquin started at admission.  She will be discharged with 8 more days of oral levaquin for a total course of 10 days of antibiotics.  SOB  The patient's symptoms of shortness of breath on exertion seemed out of proportion to the findings in her work up. Pro-BNP was within normal limits, and 2D echo shows grade 1 diastolic dysfunction.  Troponin X 3 WNL.  Venous Doppler of Lower extremities was negative ruling out DVT and probably PE as well.   Repeat CXR/ 2 view still showed LLL broncho pneumonia.  Orthostatic vital signs were negative for ortho-stasis. TSH was checked and found to be low, follow up Free T4 was within normal limits indicating subclinical hyperthyroidism.   Physical therapy evaluation was requested due to the patient's reluctance to walk and SOB.  No deficiencies were found, no  further PT recommendations were made.   Prolonged Vaginal Bleeding  Fibroids confirmed on Vaginal/Pelvic Ultrasound.  Discussed with Dr. Marice Potter of GYN.  Started Megace 10 mg tab daily.  Will follow up with [facility], Dr. Erin Fulling on 3/26.  Will likely need hysterectomy eventually.  Microcytic Anemia  Hgb 10.8, MCV 77.5.  Secondary to menorrhagia.  Started on iron supplementation and  colace.   Procedures:  2D Echo Study Conclusions  Left ventricle: The cavity size was normal. Wall thickness was normal. Systolic function was normal. The estimated ejection fraction was in the range of 55% to 60%. Wall motion was normal; there were no regional wall motion abnormalities. Doppler parameters are consistent with abnormal left ventricular relaxation (grade 1 diastolic dysfunction).    Discharge Exam: Filed Vitals:   12/06/12 0949 12/06/12 1309 12/06/12 1352 12/06/12 1408  BP:  131/69    Pulse:  81    Temp:  98.4 F (36.9 C)    TempSrc:  Oral    Resp: 48 46    Height:      Weight:      SpO2:  99% 100% 100%   General appearance : WD, Overweight, Female, Standing appears weak. Speech Clear. Speaks broken english  HEENT: Atraumatic and Normocephalic, pupils equally reactive to light and accomodation  Neck: supple, No cervical lymphadenopathy.  Chest:Good air entry bilaterally, CTA, but with non productive cough.  CVS: S1 S2 regular, no murmurs.  Abdomen: Bowel sounds present, Non tender and not distended with no gaurding, rigidity or rebound.  Extremities: B/L Lower Ext shows no edema, both legs are warm to touch  Psych:  Flat affect.  Discharge Instructions      Discharge Orders   Future Appointments Provider Department Dept Phone   12/11/2012 12:45 PM Willodean Rosenthal, MD [facility] [phone]   Future Orders Complete By Expires     Diet - low sodium heart healthy  As directed     Increase activity slowly  As directed         Medication List    TAKE these medications       albuterol 108 (90 BASE) MCG/ACT inhaler  Commonly known as:  PROVENTIL HFA;VENTOLIN HFA  Inhale 1-2 puffs into the lungs every 6 (six) hours as needed for wheezing.     DSS 100 MG Caps  Take 100 mg by mouth 2 (two) times daily.     ferrous sulfate 325 (65 FE) MG tablet  Take 1 tablet (325 mg total) by mouth 2 (two) times daily with a meal.     levofloxacin  750 MG tablet  Commonly known as:  LEVAQUIN  Take 1 tablet (750 mg total) by mouth daily.     megestrol 20 MG tablet  Commonly known as:  MEGACE  Take 0.5 tablets (10 mg total) by mouth daily.       Follow-up Information   Follow up with Willodean Rosenthal, MD On 12/11/2012. (appointment at 12:45 pm)    Contact information:   [address] [place] [address] [phone]       Follow up with Default, Provider, MD.   Contact information:   [address] [place] [address] [phone]       Please follow up. (Follow up with a primary care physician in 1 -2 weeks to ensure Pneumonia has resolved.)        The results of significant diagnostics from this hospitalization (including imaging, microbiology, ancillary and laboratory) are listed below  for reference.    Significant Diagnostic Studies: Dg Chest 2 View  12/05/2012  *RADIOLOGY REPORT*  Clinical Data: Dizziness and shortness of breath and standing. Cough and cold like symptoms.  CHEST - 2 VIEW  Comparison: Chest x-ray 12/04/2012.  Findings: Persistent opacities in the left lower lobe concerning for residual bronchopneumonia.  Lungs are otherwise clear.  Trace left pleural effusion.  No evidence of pulmonary edema.  Heart size is borderline enlarged.  Upper mediastinal contours are within normal limits.  IMPRESSION: 1.  Persistent findings compatible with left lower lobe bronchopneumonia.  Follow-up radiographs after appropriate trial of antimicrobial therapy is recommended to ensure complete resolution of these findings.   Original Report Authenticated By: Trudie Reed, M.D.    Dg Chest 2 View  12/04/2012  *RADIOLOGY REPORT*  Clinical Data: Cough, chest pain and weakness.  CHEST - 2 VIEW  Comparison: 08/21/2012 radiograph  Findings: Left lower lobe airspace disease is compatible with pneumonia. The cardiomediastinal silhouette is unremarkable. The right lung is clear. There is no evidence of pleural effusion,  pulmonary edema or pneumothorax. No acute bony abnormalities are identified.  IMPRESSION: Left lower lobe pneumonia - radiographic follow up to resolution recommended.   Original Report Authenticated By: Harmon Pier, M.D.    US Transvaginal Non-ob  12/05/2012  *RADIOLOGY REPORT*  Clinical Data: Menorrhagia.  LMP 11/21/2012  TRANSABDOMINAL AND TRANSVAGINAL ULTRASOUND OF PELVIS Technique:  Both transabdominal and transvaginal ultrasound examinations of the pelvis were performed. Transabdominal technique was performed for global imaging of the pelvis including uterus, ovaries, adnexal regions, and pelvic cul-de-sac.  It was necessary to proceed with endovaginal exam following the transabdominal exam to visualize the cervix.  Comparison:  2007 and CT 2012  Findings:  Uterus: Is anteverted and anteflexed and assessed well only transabdominally due to uterine size resulting in poor visibility on endovaginal exam.  The uterus demonstrates a sagittal length of approximately 16 cm, depth of approximately 6 cm and width of approximately 10 cm.  One partial subserosal fibroid is identified associated with the anterior left upper uterine segment measuring 5.8 x 4.5 x 4.1 cm and a second partial subserosal fibroid is seen emanating off of the fundal region measuring 2.8 x 1.9 x 2.5 cm. The remainder of the myometrium is poorly evaluated and more diffuse fibroid involvement may be present.  Endometrium: Is not adequately assessed either transabdominally or endovaginally with this study  Right ovary:  In the right adnexa there is a thin-walled cystic lesion identified measuring 3.4 x 2.3 by 3.9 cm.  A similar appearance was noted in the right adnexa on prior CT from 2012 and this may represent a benign neoplastic process or hydrosalpinx.  No worrisome features are suggested transabdominally although the assessment is slightly as it is seen only with the transabdominal approach.  Left ovary: Measures 2.4 x 1.5 x 1.9 cm and has a  normal transabdominal appearance. This ovary is not seen endovaginally.  Other findings: No pelvic fluid is seen  IMPRESSION: Enlarged uterus with overall exam compromised due to the large uterine size resulting in poor endovaginal evaluation.  At least two measurable fibroids are seen transabdominally.  Right adnexal cystic lesion with no obvious worrisome features and similar in appearance to findings in the right adnexa on prior CT from 2012. If this is a persistent lesion from 2012 this would suggest a benign etiology.  Alternatively this could be new functional lesion on this side in this premenopausal patient.  Given the overall poor visibility of  the uterus endovaginally and history of menorrhagia, if more detailed evaluation of the uterus and the endometrium is desired, pelvic MRI could be performed .   Original Report Authenticated By: Rhodia Albright, M.D.    US Pelvis Complete  12/05/2012  *RADIOLOGY REPORT*  Clinical Data: Menorrhagia.  LMP 11/21/2012  TRANSABDOMINAL AND TRANSVAGINAL ULTRASOUND OF PELVIS Technique:  Both transabdominal and transvaginal ultrasound examinations of the pelvis were performed. Transabdominal technique was performed for global imaging of the pelvis including uterus, ovaries, adnexal regions, and pelvic cul-de-sac.  It was necessary to proceed with endovaginal exam following the transabdominal exam to visualize the cervix.  Comparison:  2007 and CT 2012  Findings:  Uterus: Is anteverted and anteflexed and assessed well only transabdominally due to uterine size resulting in poor visibility on endovaginal exam.  The uterus demonstrates a sagittal length of approximately 16 cm, depth of approximately 6 cm and width of approximately 10 cm.  One partial subserosal fibroid is identified associated with the anterior left upper uterine segment measuring 5.8 x 4.5 x 4.1 cm and a second partial subserosal fibroid is seen emanating off of the fundal region measuring 2.8 x 1.9 x 2.5 cm.  The remainder of the myometrium is poorly evaluated and more diffuse fibroid involvement may be present.  Endometrium: Is not adequately assessed either transabdominally or endovaginally with this study  Right ovary:  In the right adnexa there is a thin-walled cystic lesion identified measuring 3.4 x 2.3 by 3.9 cm.  A similar appearance was noted in the right adnexa on prior CT from 2012 and this may represent a benign neoplastic process or hydrosalpinx.  No worrisome features are suggested transabdominally although the assessment is slightly as it is seen only with the transabdominal approach.  Left ovary: Measures 2.4 x 1.5 x 1.9 cm and has a normal transabdominal appearance. This ovary is not seen endovaginally.  Other findings: No pelvic fluid is seen  IMPRESSION: Enlarged uterus with overall exam compromised due to the large uterine size resulting in poor endovaginal evaluation.  At least two measurable fibroids are seen transabdominally.  Right adnexal cystic lesion with no obvious worrisome features and similar in appearance to findings in the right adnexa on prior CT from 2012. If this is a persistent lesion from 2012 this would suggest a benign etiology.  Alternatively this could be new functional lesion on this side in this premenopausal patient.  Given the overall poor visibility of the uterus endovaginally and history of menorrhagia, if more detailed evaluation of the uterus and the endometrium is desired, pelvic MRI could be performed .   Original Report Authenticated By: Rhodia Albright, M.D.     Microbiology: Recent Results (from the past 240 hour(s))  CULTURE, EXPECTORATED SPUTUM-ASSESSMENT     Status: None   Collection Time    12/05/12 12:01 AM      Result Value Range Status   Specimen Description SPUTUM   Final   Special Requests NONE   Final   Sputum evaluation     Final   Value: THIS SPECIMEN IS ACCEPTABLE. RESPIRATORY CULTURE REPORT TO FOLLOW.   Report Status 12/05/2012 FINAL    Final  CULTURE, RESPIRATORY (NON-EXPECTORATED)     Status: None   Collection Time    12/05/12 12:01 AM      Result Value Range Status   Specimen Description SPUTUM   Final   Special Requests NONE   Final   Gram Stain     Final   Value: MODERATE  WBC PRESENT,BOTH PMN AND MONONUCLEAR     FEW SQUAMOUS EPITHELIAL CELLS PRESENT     FEW GRAM POSITIVE COCCI     IN PAIRS RARE GRAM NEGATIVE RODS   Culture NORMAL OROPHARYNGEAL FLORA   Final   Report Status PENDING   Incomplete     Labs: Basic Metabolic Panel:  Recent Labs Lab 12/04/12 1455 12/05/12 0247 12/06/12 0617  NA 141 139 141  K 3.6 3.6 4.1  CL 106 105 107  CO2  --  25 25  GLUCOSE 96 142* 108*  BUN 7 10 9   CREATININE 0.90 0.87 0.80  CALCIUM  --  8.2* 9.0   Liver Function Tests:  Recent Labs Lab 12/05/12 0247  AST 23  ALT 14  ALKPHOS 43  BILITOT 0.2*  PROT 6.7  ALBUMIN 2.8*   CBC:  Recent Labs Lab 12/04/12 1431 12/04/12 1455 12/05/12 0247 12/06/12 0617  WBC 5.9  --  3.9* 3.7*  NEUTROABS 3.2  --  1.8  --   HGB 12.4 13.3 10.8* 11.5*  HCT 36.6 39.0 31.3* 34.0*  MCV 79.6  --  77.5* 77.6*  PLT 145*  --  156 183   Cardiac Enzymes:  Recent Labs Lab 12/04/12 1904 12/04/12 2136 12/05/12 0247 12/05/12 0849  TROPONINI <0.30 <0.30 <0.30 <0.30   BNP: BNP (last 3 results)  Recent Labs  12/04/12 1904 12/04/12 2136  PROBNP 17.5 17.3     Signed:  Conley Canal [phone]  [facility] 12/06/2012, 2:36 PM  Attending  Patient seen and examined, agree with the assessment and plan as outlined above. Clinically better, remains afebrile. Seen by PT no outpatient PT needs, vaginal bleeding continues-but just started on Megace-and outpatient GYN follow up next week arranged. Hb actually up today. Stable for discharge  S Ghimire

## 2012-12-06 NOTE — Progress Notes (Signed)
Patient ambulate in the hallway 200 meter ,complain of shortness of breath and dizziness.Check vitals sign 133/79,80.,49-respiration,96-98 % at room air.

## 2012-12-06 NOTE — Progress Notes (Signed)
Pt. discharged to floor,verbalized understanding of discharged instruction,medication,restriction,diet and follow up appointment.Baseline Vitals sign stable,Pt comfortable,no sign and symptom of distress. 

## 2012-12-07 LAB — CULTURE, RESPIRATORY W GRAM STAIN: Culture: NORMAL

## 2012-12-07 LAB — LEGIONELLA ANTIGEN, URINE

## 2012-12-11 ENCOUNTER — Other Ambulatory Visit (HOSPITAL_COMMUNITY)
Admission: RE | Admit: 2012-12-11 | Discharge: 2012-12-11 | Disposition: A | Discharge status: Home / Self Care | Payer: Medicaid Other | Source: Ambulatory Visit | Attending: Obstetrics & Gynecology | Admitting: Obstetrics & Gynecology

## 2012-12-11 ENCOUNTER — Encounter: Payer: Self-pay | Admitting: Obstetrics & Gynecology

## 2012-12-11 ENCOUNTER — Ambulatory Visit (INDEPENDENT_AMBULATORY_CARE_PROVIDER_SITE_OTHER): Payer: Self-pay | Admitting: Obstetrics & Gynecology

## 2012-12-11 VITALS — BP 119/78 | HR 85 | Temp 98.0°F | Ht 61.0 in | Wt 179.4 lb

## 2012-12-11 DIAGNOSIS — N938 Other specified abnormal uterine and vaginal bleeding: Secondary | ICD-10-CM | POA: Insufficient documentation

## 2012-12-11 DIAGNOSIS — N920 Excessive and frequent menstruation with regular cycle: Secondary | ICD-10-CM

## 2012-12-11 DIAGNOSIS — D649 Anemia, unspecified: Secondary | ICD-10-CM

## 2012-12-11 DIAGNOSIS — N949 Unspecified condition associated with female genital organs and menstrual cycle: Secondary | ICD-10-CM | POA: Insufficient documentation

## 2012-12-11 MED ORDER — MEGESTROL ACETATE 20 MG PO TABS: 40.0000 mg | ORAL_TABLET | Freq: Every day | ORAL | Status: DC

## 2012-12-11 NOTE — Patient Instructions (Addendum)
Menorrhagia Dysfunctional uterine bleeding is different from a normal menstrual period. When periods are heavy or there is more bleeding than is usual for you, it is called menorrhagia. It may be caused by hormonal imbalance, or physical, metabolic, or other problems. Examination is necessary in order that your caregiver may treat treatable causes. If this is a continuing problem, a D&C may be needed. That means that the cervix (the opening of the uterus or womb) is dilated (stretched larger) and the lining of the uterus is scraped out. The tissue scraped out is then examined under a microscope by a specialist (pathologist) to make sure there is nothing of concern that needs further or more extensive treatment. HOME CARE INSTRUCTIONS   If medications were prescribed, take exactly as directed. Do not change or switch medications without consulting your caregiver.  Long term heavy bleeding may result in iron deficiency. Your caregiver may have prescribed iron pills. They help replace the iron your body lost from heavy bleeding. Take exactly as directed. Iron may cause constipation. If this becomes a problem, increase the bran, fruits, and roughage in your diet.  Do not take aspirin or medicines that contain aspirin one week before or during your menstrual period. Aspirin may make the bleeding worse.  If you need to change your sanitary pad or tampon more than once every 2 hours, stay in bed and rest as much as possible until the bleeding stops.  Eat well-balanced meals. Eat foods high in iron. Examples are leafy green vegetables, meat, liver, eggs, and whole grain breads and cereals. Do not try to lose weight until the abnormal bleeding has stopped and your blood iron level is back to normal. SEEK MEDICAL CARE IF:   You need to change your sanitary pad or tampon more than once an hour.  You develop nausea (feeling sick to your stomach) and vomiting, dizziness, or diarrhea while you are taking your  medicine.  You have any problems that may be related to the medicine you are taking. SEEK IMMEDIATE MEDICAL CARE IF:   You have a fever.  You develop chills.  You develop severe bleeding or start to pass blood clots.  You feel dizzy or faint. MAKE SURE YOU:   Understand these instructions.  Will watch your condition.  Will get help right away if you are not doing well or get worse. Document Released: 09/04/2005 Document Revised: 11/27/2011 Document Reviewed: 04/24/2008 [facility] Patient Information 2013 [place], [place]. Uterine Fibroid A uterine fibroid is a growth (tumor) that occurs in a woman's uterus. This type of tumor is not cancerous and does not spread out of the uterus. A woman can have one or many fibroids, and the fiboid(s) can become quite large. A fibroid can vary in size, weight, and where it grows in the uterus. Most fibroids do not require medical treatment, but some can cause pain or heavy bleeding during and between periods. CAUSES  A fibroid is the result of a single uterine cell that keeps growing (unregulated), which is different than most cells in the human body. Most cells have a control mechanism that keeps them from reproducing without control.  SYMPTOMS   Bleeding.  Pelvic pain and pressure.  Bladder problems due to the size of the fibroid.  Infertility and miscarriages depending on the size and location of the fibroid. DIAGNOSIS  A diagnosis is made by physical exam. Your caregiver may feel the lumpy tumors during a pelvic exam. Important information regarding size, location, and number of tumors  can be gained by having an ultrasound. It is rare that other tests, such as a CT scan or MRI, are needed. TREATMENT   Your caregiver may recommend watchful waiting. This involves getting the fibroid checked by your caregiver to see if the fibroids grow or shrink.   Hormonal treatment or an intrauterine device (IUD) may be prescribed.   Surgery may be  needed to remove the fibroids (myomectomy) or the uterus (hysterectomy). This depends on your situation. When fibroids interfere with fertility and a woman wants to become pregnant, a caregiver may recommend having the fibroids removed.  HOME CARE INSTRUCTIONS  Home care depends on how you were treated. In general:   Keep all follow-up appointments with your caregiver.   Only take medicine as told by your caregiver. Do not take aspirin. It can cause bleeding.   If you have excessive periods and soak tampons or pads in a half hour or less, contact your caregiver immediately. If your periods are troublesome but not so heavy, lie down with your feet raised slightly above your heart. Place cold packs on your lower abdomen.   If your periods are heavy, write down the number of pads or tampons you use per month. Bring this information to your caregiver.   Talk to your caregiver about taking iron pills.   Include green vegetables in your diet.   If you were prescribed a hormonal treatment, take the hormonal medicines as directed.   If you need surgery, ask your caregiver for information on your specific surgery.  SEEK IMMEDIATE MEDICAL CARE IF:  You have pelvic pain or cramps not controlled with medicines.   You have a sudden increase in pelvic pain.   You have an increase of bleeding between and during periods.   You feel lightheaded or have fainting episodes.  MAKE SURE YOU:  Understand these instructions.  Will watch your condition.  Will get help right away if you are not doing well or get worse. Document Released: 09/01/2000 Document Revised: 11/27/2011 Document Reviewed: 09/25/2011 [facility] Patient Information 2013 [place], [place].

## 2012-12-11 NOTE — Progress Notes (Signed)
Pt. Is here for fibroids and dysfunctional uterine bleedings.  She has been on Megace and is no longer bleeding.  Her main complaints today are a cough and diarrhea.  Patient states she was dx with pneumonia.

## 2012-12-11 NOTE — Progress Notes (Signed)
Subjective:     Patient ID: [name], female   DOB: 1966-08-29, 47 y.o.   MRN: [id number]  HPI Pt c/o heavy irregular uterine bleeding for 1 moths or more.  She c/o weight loss.  Has recently been treated for pneumonia.  No current fever or chills.  Past Medical History  Diagnosis Date  . Asthma    Past Surgical History  Procedure Laterality Date  . Cesarean section  1995   Current Outpatient Prescriptions on File Prior to Visit  Medication Sig Dispense Refill  . albuterol (PROVENTIL HFA;VENTOLIN HFA) 108 (90 BASE) MCG/ACT inhaler Inhale 1-2 puffs into the lungs every 6 (six) hours as needed for wheezing.  1 Inhaler  0  . ferrous sulfate 325 (65 FE) MG tablet Take 1 tablet (325 mg total) by mouth 2 (two) times daily with a meal.  60 tablet  1  . levofloxacin (LEVAQUIN) 750 MG tablet Take 1 tablet (750 mg total) by mouth daily.  8 tablet  0  . docusate sodium 100 MG CAPS Take 100 mg by mouth 2 (two) times daily.  10 capsule     No current facility-administered medications on file prior to visit.  No Known Allergies     Review of Systems     Objective:   Physical Exam BP 119/78  Pulse 85  Temp(Src) 98 F (36.7 C) (Oral)  Ht 5\' 1"  (1.549 m)  Wt 179 lb 6.4 oz (81.375 kg)  BMI 33.91 kg/m2  LMP 11/20/2012 The indications for endometrial biopsy were reviewed.   Risks of the biopsy including cramping, bleeding, infection, uterine perforation, inadequate specimen and need for additional procedures  were discussed. The patient states she understands and agrees to undergo procedure today. Consent was signed. Time out was performed. Urine HCG was negative. A sterile speculum was placed in the patient's vagina and the cervix was prepped with Betadine. A single-toothed tenaculum was placed on the anterior lip of the cervix to stabilize it. The 3 mm pipelle was introduced into the endometrial cavity without difficulty to a depth of 10cm, and a moderate amount of tissue was obtained  and sent to pathology. The instruments were removed from the patient's vagina. Minimal bleeding from the cervix was noted. The patient tolerated the procedure well.     Comparison: 2007 and CT 2012  Findings:  Uterus: Is anteverted and anteflexed and assessed well only  transabdominally due to uterine size resulting in poor visibility  on endovaginal exam. The uterus demonstrates a sagittal length of  approximately 16 cm, depth of approximately 6 cm and width of  approximately 10 cm. One partial subserosal fibroid is identified  associated with the anterior left upper uterine segment measuring  5.8 x 4.5 x 4.1 cm and a second partial subserosal fibroid is seen  emanating off of the fundal region measuring 2.8 x 1.9 x 2.5 cm.  The remainder of the myometrium is poorly evaluated and more  diffuse fibroid involvement may be present.  Endometrium: Is not adequately assessed either transabdominally or  endovaginally with this study  Right ovary: In the right adnexa there is a thin-walled cystic  lesion identified measuring 3.4 x 2.3 by 3.9 cm. A similar  appearance was noted in the right adnexa on prior CT from 2012 and  this may represent a benign neoplastic process or hydrosalpinx. No  worrisome features are suggested transabdominally although the  assessment is slightly as it is seen only with the transabdominal  approach.  Left ovary: Measures  2.4 x 1.5 x 1.9 cm and has a normal  transabdominal appearance. This ovary is not seen endovaginally.  Other findings: No pelvic fluid is seen  IMPRESSION:  Enlarged uterus with overall exam compromised due to the large  uterine size resulting in poor endovaginal evaluation. At least  two measurable fibroids are seen transabdominally. Right adnexal  cystic lesion with no obvious worrisome features and similar in  appearance to findings in the right adnexa on prior CT from 2012.  If this is a persistent lesion from 2012 this would suggest a   benign etiology. Alternatively this could be new functional lesion  on this side in this premenopausal patient.  Given the overall poor visibility of the uterus endovaginally and  history of menorrhagia, if more detailed evaluation of the uterus  and the endometrium is desired, pelvic MRI could be performed .      Assessment:     Irreg heavy uterine bleeding- s/p Endo bx today.  Sx improved on Megace      Plan:     Cont Megace 40mg  q day F/u surg path F/u in 4 week to review path and discuss further treatment options  Routine post-procedure instructions were given to the patient. The patient will follow up to review the results and for further management.

## 2012-12-12 ENCOUNTER — Encounter: Payer: Self-pay | Admitting: *Deleted

## 2012-12-26 ENCOUNTER — Emergency Department (INDEPENDENT_AMBULATORY_CARE_PROVIDER_SITE_OTHER)
Admission: EM | Admit: 2012-12-26 | Discharge: 2012-12-26 | Disposition: A | Discharge status: Home / Self Care | Payer: Self-pay | Source: Home / Self Care | Attending: Family Medicine | Admitting: Family Medicine

## 2012-12-26 ENCOUNTER — Encounter (HOSPITAL_COMMUNITY): Payer: Self-pay

## 2012-12-26 DIAGNOSIS — J189 Pneumonia, unspecified organism: Secondary | ICD-10-CM

## 2012-12-26 DIAGNOSIS — M25511 Pain in right shoulder: Secondary | ICD-10-CM

## 2012-12-26 DIAGNOSIS — M25519 Pain in unspecified shoulder: Secondary | ICD-10-CM

## 2012-12-26 DIAGNOSIS — D649 Anemia, unspecified: Secondary | ICD-10-CM

## 2012-12-26 DIAGNOSIS — N920 Excessive and frequent menstruation with regular cycle: Secondary | ICD-10-CM

## 2012-12-26 MED ORDER — NAPROXEN 500 MG PO TABS: ORAL_TABLET | ORAL | Status: DC

## 2012-12-26 MED ORDER — ACIDOPHILUS PROBIOTIC 100 MG PO CAPS: 1.0000 | ORAL_CAPSULE | Freq: Three times a day (TID) | ORAL | Status: DC

## 2012-12-26 NOTE — ED Notes (Signed)
Hospital follow up-was admitted at [facility] Dx- pnuemonia

## 2012-12-26 NOTE — ED Provider Notes (Addendum)
History     CSN: [id number]  Arrival date & time 12/26/12  1346   First MD Initiated Contact with Patient 12/26/12 1426      Chief Complaint  Patient presents with  . Follow-up    (Consider location/radiation/quality/duration/timing/severity/associated sxs/prior treatment) HPI Pt reports that pneumonia is better now.  She completed her antibiotics.  She is taking her other medications for anemia and is following up with gynecology for her menorrhagia.  She is reporting that she is having right shoulder pain and tenderness that has been chronic.  She is having loose stools since completing the antibiotics.    Past Medical History  Diagnosis Date  . Asthma     Past Surgical History  Procedure Laterality Date  . Cesarean section  1995    No family history on file.  History  Substance Use Topics  . Smoking status: Never Smoker   . Smokeless tobacco: Not on file  . Alcohol Use: No    OB History   Grav Para Term Preterm Abortions TAB SAB Ect Mult Living   10 10 5 5      5       Review of Systems Constitutional: Negative.  HENT: Negative.  Respiratory: Negative.  Cardiovascular: Negative.  Gastrointestinal: loose stools   Endocrine: Negative.  Genitourinary: Negative.  Musculoskeletal: right shoulder pain   Skin: Negative.  Allergic/Immunologic: Negative.  Neurological: Negative.  Hematological: Negative.  Psychiatric/Behavioral: Negative.  All other systems reviewed and are negative   Allergies  Review of patient's allergies indicates no known allergies.  Home Medications   Current Outpatient Rx  Name  Route  Sig  Dispense  Refill  . albuterol (PROVENTIL HFA;VENTOLIN HFA) 108 (90 BASE) MCG/ACT inhaler   Inhalation   Inhale 1-2 puffs into the lungs every 6 (six) hours as needed for wheezing.   1 Inhaler   0   . docusate sodium 100 MG CAPS   Oral   Take 100 mg by mouth 2 (two) times daily.   10 capsule      . ferrous sulfate 325 (65 FE) MG  tablet   Oral   Take 1 tablet (325 mg total) by mouth 2 (two) times daily with a meal.   60 tablet   1   . levofloxacin (LEVAQUIN) 750 MG tablet   Oral   Take 1 tablet (750 mg total) by mouth daily.   8 tablet   0   . megestrol (MEGACE) 20 MG tablet   Oral   Take 2 tablets (40 mg total) by mouth daily.   60 tablet   2     BP 122/69  Pulse 72  Temp(Src) 98 F (36.7 C) (Oral)  Resp 16  SpO2 100%  LMP 11/20/2012  Physical Exam Nursing note and vitals reviewed.  Constitutional: She is oriented to person, place, and time. She appears well-developed and well-nourished. No distress.  HENT:  Head: Normocephalic and atraumatic.  Eyes: Conjunctivae and EOM are normal. Pupils are equal, round, and reactive to light.  Neck: Normal range of motion. Neck supple. No JVD present. No tracheal deviation present. No thyromegaly present.  Cardiovascular: Normal rate, regular rhythm and normal heart sounds.  Pulmonary/Chest: Effort normal and breath sounds normal. No respiratory distress. She has no wheezes.  Abdominal: Soft. Bowel sounds are normal.  Musculoskeletal: tenderness of right shoulder AC joint,  Normal range of motion. She exhibits no edema and no tenderness.  Lymphadenopathy:  She has no cervical adenopathy.  Neurological: She is  alert and oriented to person, place, and time. She has normal reflexes.  Skin: Skin is warm and dry.  Psychiatric: She has a normal mood and affect. Her behavior is normal. Judgment and thought content normal.    ED Course  Procedures (including critical care time)  Labs Reviewed - No data to display No results found.  No diagnosis found.  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  The patient verbalized understanding.  The patient was told to call to get lab results if they haven't heard anything in the next week.            Cleora Fleet, MD 12/26/12 1436  [place] Cyndie Mull, MD 12/26/12 1440

## 2013-02-13 ENCOUNTER — Encounter (HOSPITAL_COMMUNITY): Payer: Self-pay

## 2013-02-13 ENCOUNTER — Inpatient Hospital Stay (HOSPITAL_COMMUNITY): Payer: Medicaid Other

## 2013-02-13 ENCOUNTER — Inpatient Hospital Stay (HOSPITAL_COMMUNITY)
Admission: AD | Admit: 2013-02-13 | Discharge: 2013-02-13 | Disposition: A | Discharge status: Home / Self Care | Payer: Medicaid Other | Source: Ambulatory Visit | Attending: Family Medicine | Admitting: Family Medicine

## 2013-02-13 DIAGNOSIS — D259 Leiomyoma of uterus, unspecified: Secondary | ICD-10-CM

## 2013-02-13 DIAGNOSIS — N920 Excessive and frequent menstruation with regular cycle: Secondary | ICD-10-CM

## 2013-02-13 DIAGNOSIS — R109 Unspecified abdominal pain: Secondary | ICD-10-CM | POA: Insufficient documentation

## 2013-02-13 DIAGNOSIS — D219 Benign neoplasm of connective and other soft tissue, unspecified: Secondary | ICD-10-CM

## 2013-02-13 LAB — COMPREHENSIVE METABOLIC PANEL
AST: 17 U/L (ref 0–37)
Albumin: 3.5 g/dL (ref 3.5–5.2)
Alkaline Phosphatase: 44 U/L (ref 39–117)
CO2: 26 mEq/L (ref 19–32)
Chloride: 106 mEq/L (ref 96–112)
Creatinine, Ser: 0.86 mg/dL (ref 0.50–1.10)
GFR calc non Af Amer: 78 mL/min — ABNORMAL LOW (ref >90)
Potassium: 4 mEq/L (ref 3.5–5.1)
Total Bilirubin: 0.2 mg/dL — ABNORMAL LOW (ref 0.3–1.2)

## 2013-02-13 LAB — URINALYSIS, ROUTINE W REFLEX MICROSCOPIC
Bilirubin Urine: NEGATIVE
Glucose, UA: NEGATIVE mg/dL
Protein, ur: NEGATIVE mg/dL
Urobilinogen, UA: 0.2 mg/dL (ref 0.0–1.0)

## 2013-02-13 LAB — CBC
HCT: 32.8 % — ABNORMAL LOW (ref 36.0–46.0)
MCV: 81.4 fL (ref 78.0–100.0)
Platelets: 214 10*3/uL (ref 150–400)
RBC: 4.03 MIL/uL (ref 3.87–5.11)
WBC: 4.7 10*3/uL (ref 4.0–10.5)

## 2013-02-13 LAB — URINE MICROSCOPIC-ADD ON

## 2013-02-13 MED ORDER — MEGESTROL ACETATE 40 MG PO TABS: 40.0000 mg | ORAL_TABLET | Freq: Two times a day (BID) | ORAL | Status: DC

## 2013-02-13 MED ORDER — IBUPROFEN 800 MG PO TABS: 800.0000 mg | ORAL_TABLET | Freq: Three times a day (TID) | ORAL | Status: DC

## 2013-02-13 MED ORDER — KETOROLAC TROMETHAMINE 60 MG/2ML IM SOLN
60.0000 mg | Freq: Once | INTRAMUSCULAR | Status: AC
  Administered 2013-02-13: 60 mg via INTRAMUSCULAR
  Filled 2013-02-13: qty 2

## 2013-02-13 NOTE — MAU Note (Signed)
Patient states she has been having irregular periods for a while. For the past 2 weeks has been bleeding with a lot of clots and abdominal pain.

## 2013-02-13 NOTE — MAU Note (Signed)
Patient is in with c/o heavy vaginal bleeding with clots, and abdominal pain. She denies n/v. She states that this is ongoing problem. Went to the [facility] clinic and was told to come up here.

## 2013-02-13 NOTE — MAU Note (Signed)
Stressed importance to patient and her daughter to keep her clinic apointment

## 2013-02-13 NOTE — MAU Provider Note (Signed)
History     CSN: [id number]  Arrival date and time: 02/13/13 1145   None     Chief Complaint  Patient presents with  . Vaginal Bleeding  . Abdominal Pain   HPI Pt is not pregnant and is here with heavy bleeding with clots as well as abdominal pain without nausea or vomiting.  Pt was seen in the GYN clinic on 12/11/2012 for evaluation of menorrhagia and had benign endometrial bx.  Pt was to be followed back up in the clinic in 4 weeks to discuss options for treatment.  Pt had previously been on Megace with arrest of bleeding.  Pt wants surgery and is tired of bleeding..   Nurse's noted- Patient is in with c/o heavy vaginal bleeding with clots, and abdominal pain. She denies n/v. She states that this is ongoing problem. Went to the [facility] clinic and was told to come up   Past Medical History  Diagnosis Date  . Asthma     Past Surgical History  Procedure Laterality Date  . Cesarean section  1995    History reviewed. No pertinent family history.  History  Substance Use Topics  . Smoking status: Never Smoker   . Smokeless tobacco: Not on file  . Alcohol Use: No    Allergies: No Known Allergies  Prescriptions prior to admission  Medication Sig Dispense Refill  . albuterol (PROVENTIL HFA;VENTOLIN HFA) 108 (90 BASE) MCG/ACT inhaler Inhale 1-2 puffs into the lungs every 6 (six) hours as needed for wheezing.  1 Inhaler  0  . docusate sodium 100 MG CAPS Take 100 mg by mouth 2 (two) times daily.  10 capsule    . ferrous sulfate 325 (65 FE) MG tablet Take 1 tablet (325 mg total) by mouth 2 (two) times daily with a meal.  60 tablet  1  . Lactobacillus (ACIDOPHILUS PROBIOTIC) 100 MG CAPS Take 1 capsule (100 mg total) by mouth 3 (three) times daily with meals.  90 capsule  0  . megestrol (MEGACE) 20 MG tablet Take 2 tablets (40 mg total) by mouth daily.  60 tablet  2  . naproxen (NAPROSYN) 500 MG tablet Take 1 po every 12 hours with food prn shoulder pain  20 tablet  0    ROS Physical  Exam   Blood pressure 131/80, pulse 71, temperature 98.4 F (36.9 C), temperature source Oral, resp. rate 20, height 5' 0.25" (1.53 m), weight 84.006 kg (185 lb 3.2 oz), last menstrual period 01/20/2013.  Physical Exam  Nursing note and vitals reviewed. Constitutional: She is oriented to person, place, and time. She appears well-developed and well-nourished. No distress.  HENT:  Head: Normocephalic.  Eyes: Pupils are equal, round, and reactive to light.  Neck: Normal range of motion. Neck supple.  Respiratory: Effort normal.  GI: Soft. She exhibits no distension. There is tenderness. There is no rebound.  Genitourinary:  Small amount of bright red blood in vault; cervix clean NT; uterus enlarged irregular; adnexal difficult to outline  Musculoskeletal: Normal range of motion.  Neurological: She is alert and oriented to person, place, and time.  Skin: Skin is warm and dry.  Psychiatric: She has a normal mood and affect.    MAU Course  Procedures Results for orders placed during the hospital encounter of 02/13/13 (from the past 24 hour(s))  URINALYSIS, ROUTINE W REFLEX MICROSCOPIC     Status: Abnormal   Collection Time    02/13/13 12:40 PM      Result Value Range  Color, Urine YELLOW  YELLOW   APPearance CLEAR  CLEAR   Specific Gravity, Urine 1.025  1.005 - 1.030   pH 6.0  5.0 - 8.0   Glucose, UA NEGATIVE  NEGATIVE mg/dL   Hgb urine dipstick MODERATE (*) NEGATIVE   Bilirubin Urine NEGATIVE  NEGATIVE   Ketones, ur NEGATIVE  NEGATIVE mg/dL   Protein, ur NEGATIVE  NEGATIVE mg/dL   Urobilinogen, UA 0.2  0.0 - 1.0 mg/dL   Nitrite NEGATIVE  NEGATIVE   Leukocytes, UA NEGATIVE  NEGATIVE  URINE MICROSCOPIC-ADD ON     Status: None   Collection Time    02/13/13 12:40 PM      Result Value Range   Squamous Epithelial / LPF RARE  RARE   RBC / HPF 11-20  <3 RBC/hpf   Bacteria, UA RARE  RARE  POCT PREGNANCY, URINE     Status: None   Collection Time    02/13/13 12:56 PM      Result  Value Range   Preg Test, Ur NEGATIVE  NEGATIVE  CBC     Status: Abnormal   Collection Time    02/13/13  2:41 PM      Result Value Range   WBC 4.7  4.0 - 10.5 K/uL   RBC 4.03  3.87 - 5.11 MIL/uL   Hemoglobin 10.7 (*) 12.0 - 15.0 g/dL   HCT 78.2 (*) 95.6 - 21.3 %   MCV 81.4  78.0 - 100.0 fL   MCH 26.6  26.0 - 34.0 pg   MCHC 32.6  30.0 - 36.0 g/dL   RDW 08.6  57.8 - 46.9 %   Platelets 214  150 - 400 K/uL  COMPREHENSIVE METABOLIC PANEL     Status: Abnormal   Collection Time    02/13/13  2:41 PM      Result Value Range   Sodium 140  135 - 145 mEq/L   Potassium 4.0  3.5 - 5.1 mEq/L   Chloride 106  96 - 112 mEq/L   CO2 26  19 - 32 mEq/L   Glucose, Bld 84  70 - 99 mg/dL   BUN 10  6 - 23 mg/dL   Creatinine, Ser 6.29  0.50 - 1.10 mg/dL   Calcium 9.1  8.4 - 52.8 mg/dL   Total Protein 6.8  6.0 - 8.3 g/dL   Albumin 3.5  3.5 - 5.2 g/dL   AST 17  0 - 37 U/L   ALT 14  0 - 35 U/L   Alkaline Phosphatase 44  39 - 117 U/L   Total Bilirubin 0.2 (*) 0.3 - 1.2 mg/dL   GFR calc non Af Amer 78 (*) >90 mL/min   GFR calc Af Amer >90  >90 mL/min  discussed with pt and daughter that she was to f/u in clinic after her endometrial bx to discuss options for treatment.  Note sent to GYN clinic to have pt f/u with Dr. Erin Fulling Prescription for Megace 40mg  BID #60 prescribed for pt to take until appointment Pt got relief of abdominal pain with Toradol 60mg  IM- will give prescription for Ibuprofen 800mg   Assessment and Plan  Menorrhagia with fibroids- Megace 40mg  BID; ibuprofen F/u in GYN clinic  [facility] 02/13/2013, 3:06 PM

## 2013-02-14 NOTE — MAU Provider Note (Signed)
Chart reviewed and agree with management and plan.  

## 2013-02-20 ENCOUNTER — Ambulatory Visit (INDEPENDENT_AMBULATORY_CARE_PROVIDER_SITE_OTHER): Payer: Medicaid Other | Admitting: Obstetrics & Gynecology

## 2013-02-20 VITALS — BP 121/78 | HR 84 | Temp 98.3°F | Ht 60.0 in | Wt 186.0 lb

## 2013-02-20 DIAGNOSIS — N924 Excessive bleeding in the premenopausal period: Secondary | ICD-10-CM

## 2013-02-20 MED ORDER — NORGESTIMATE-ETH ESTRADIOL 0.25-35 MG-MCG PO TABS: 1.0000 | ORAL_TABLET | Freq: Every day | ORAL | Status: DC

## 2013-02-20 NOTE — Patient Instructions (Addendum)
Uterine Fibroid A uterine fibroid is a growth (tumor) that occurs in a woman's uterus. This type of tumor is not cancerous and does not spread out of the uterus. A woman can have one or many fibroids, and the fiboid(s) can become quite large. A fibroid can vary in size, weight, and where it grows in the uterus. Most fibroids do not require medical treatment, but some can cause pain or heavy bleeding during and between periods. CAUSES  A fibroid is the result of a single uterine cell that keeps growing (unregulated), which is different than most cells in the human body. Most cells have a control mechanism that keeps them from reproducing without control.  SYMPTOMS   Bleeding.  Pelvic pain and pressure.  Bladder problems due to the size of the fibroid.  Infertility and miscarriages depending on the size and location of the fibroid. DIAGNOSIS  A diagnosis is made by physical exam. Your caregiver may feel the lumpy tumors during a pelvic exam. Important information regarding size, location, and number of tumors can be gained by having an ultrasound. It is rare that other tests, such as a CT scan or MRI, are needed. TREATMENT   Your caregiver may recommend watchful waiting. This involves getting the fibroid checked by your caregiver to see if the fibroids grow or shrink.   Hormonal treatment or an intrauterine device (IUD) may be prescribed.   Surgery may be needed to remove the fibroids (myomectomy) or the uterus (hysterectomy). This depends on your situation. When fibroids interfere with fertility and a woman wants to become pregnant, a caregiver may recommend having the fibroids removed.  HOME CARE INSTRUCTIONS  Home care depends on how you were treated. In general:   Keep all follow-up appointments with your caregiver.   Only take medicine as told by your caregiver. Do not take aspirin. It can cause bleeding.   If you have excessive periods and soak tampons or pads in a half hour or  less, contact your caregiver immediately. If your periods are troublesome but not so heavy, lie down with your feet raised slightly above your heart. Place cold packs on your lower abdomen.   If your periods are heavy, write down the number of pads or tampons you use per month. Bring this information to your caregiver.   Talk to your caregiver about taking iron pills.   Include green vegetables in your diet.   If you were prescribed a hormonal treatment, take the hormonal medicines as directed.   If you need surgery, ask your caregiver for information on your specific surgery.  SEEK IMMEDIATE MEDICAL CARE IF:  You have pelvic pain or cramps not controlled with medicines.   You have a sudden increase in pelvic pain.   You have an increase of bleeding between and during periods.   You feel lightheaded or have fainting episodes.  MAKE SURE YOU:  Understand these instructions.  Will watch your condition.  Will get help right away if you are not doing well or get worse. Document Released: 09/01/2000 Document Revised: 11/27/2011 Document Reviewed: 09/25/2011 [facility] Patient Information 2014 [place], [place]. Fibroids Fibroids are lumps (tumors) that can occur any place in a woman's body. These lumps are not cancerous. Fibroids vary in size, weight, and where they grow. HOME CARE  Do not take aspirin.  Write down the number of pads or tampons you use during your period. Tell your doctor. This can help determine the best treatment for you. GET HELP RIGHT AWAY IF:  You have pain in your lower belly (abdomen) that is not helped with medicine.  You have cramps that are not helped with medicine.  You have more bleeding between or during your period.  You feel lightheaded or pass out (faint).  Your lower belly pain gets worse. MAKE SURE YOU:  Understand these instructions.  Will watch your condition.  Will get help right away if you are not doing well or get  worse. Document Released: 10/07/2010 Document Revised: 11/27/2011 Document Reviewed: 10/07/2010 [facility] Patient Information 2014 [place], [place]. Hysterectomy Information  A hysterectomy is a procedure where your uterus is surgically removed. It will no longer be possible to have menstrual periods or to become pregnant. The tubes and ovaries can be removed (bilateral salpingo-oopherectomy) during this surgery as well.  REASONS FOR A HYSTERECTOMY  Persistent, abnormal bleeding.  Lasting (chronic) pelvic pain or infection.  The lining of the uterus (endometrium) starts growing outside the uterus (endometriosis).  The endometrium starts growing in the muscle of the uterus (adenomyosis).  The uterus falls down into the vagina (pelvic organ prolapse).  Symptomatic uterine fibroids.  Precancerous cells.  Cervical cancer or uterine cancer. TYPES OF HYSTERECTOMIES  Supracervical hysterectomy. This type removes the top part of the uterus, but not the cervix.  Total hysterectomy. This type removes the uterus and cervix.  Radical hysterectomy. This type removes the uterus, cervix, and the fibrous tissue that holds the uterus in place in the pelvis (parametrium). WAYS A HYSTERECTOMY CAN BE PERFORMED  Abdominal hysterectomy. A large surgical cut (incision) is made in the abdomen. The uterus is removed through this incision.  Vaginal hysterectomy. An incision is made in the vagina. The uterus is removed through this incision. There are no abdominal incisions.  Conventional laparoscopic hysterectomy. A thin, lighted tube with a camera (laparoscope) is inserted into 3 or 4 small incisions in the abdomen. The uterus is cut into small pieces. The small pieces are removed through the incisions, or they are removed through the vagina.  Laparoscopic assisted vaginal hysterectomy (LAVH). Three or four small incisions are made in the abdomen. Part of the surgery is performed laparoscopically and part  vaginally. The uterus is removed through the vagina.  Robot-assisted laparoscopic hysterectomy. A laparoscope is inserted into 3 or 4 small incisions in the abdomen. A computer-controlled device is used to give the surgeon a 3D image. This allows for more precise movements of surgical instruments. The uterus is cut into small pieces and removed through the incisions or removed through the vagina. RISKS OF HYSTERECTOMY   Bleeding and risk of blood transfusion. Tell your caregiver if you do not want to receive any blood products.  Blood clots in the legs or lung.  Infection.  Injury to surrounding organs.  Anesthesia problems or side effects.  Conversion to an abdominal hysterectomy. WHAT TO EXPECT AFTER A HYSTERECTOMY  You will be given pain medicine.  You will need to have someone with you for the first 3 to 5 days after you go home.  You will need to follow up with your surgeon in 2 to 4 weeks after surgery to evaluate your progress.  You may have early menopause symptoms like hot flashes, night sweats, and insomnia.  If you had a hysterectomy for a problem that was not a cancer or a condition that could lead to cancer, then you no longer need Pap tests. However, even if you no longer need a Pap test, a regular exam is a good idea to make  sure no other problems are starting. Document Released: 02/28/2001 Document Revised: 11/27/2011 Document Reviewed: 04/15/2011 [facility] Patient Information 2014 [place], [place]. Laparoscopic Assisted Vaginal Hysterectomy  A laparoscopic assisted vaginal hysterectomy (LAVH) is a surgical procedure to remove the uterus and cervix, and sometimes the ovaries and fallopian tubes. During an LAVH, some of the surgical removal is done through the vagina and the rest is done through a few small surgical cuts (incisions) in the abdomen.  This procedure is usually considered in women when a vaginal hysterectomy (VH) is not an option. Your surgeon will discuss  the risks and benefits of the different surgical techniques at your appointment. Symptoms such as chronic pelvic pain, pressure, and heavy or painful periods can be treated with an LAVH. Generally, recovery time is faster and there are fewer complications after laparoscopic procedures than after open incisional procedures. LET YOUR CAREGIVER KNOW ABOUT:   Allergies to food or medicine.  Medicines taken, including vitamins, herbs, eyedrops, over-the-counter medicines, and creams.  Use of steroids (by mouth or creams).  Previous problems with anesthetics or numbing medicines.  History of bleeding problems or blood clots.  Previous surgery.  Other health problems, including diabetes and kidney problems.  Possibility of pregnancy. RISKS AND COMPLICATIONS  Allergies to medicines.  Difficulty breathing.  Bleeding.  Infection.  Damage to other structures near the uterus and cervix. BEFORE THE PROCEDURE  Ask your caregiver about changing or stopping your regular medicines.  Take certain medicines, such as a colon emptying preparation, as directed.  Do not eat or drink anything for at least 8 hours before your surgery.  Take a shower at home the night before your procedure.  Arrive without jewelry, makeup, nail polish, or any lotions or deodorants.  Stop smoking if you smoke. Stopping will improve your health after surgery.  Arrange for a ride home after surgery and for help at home during recovery. PROCEDURE   An intravenous (IV) access tube will be put into one of your veins in order to give you fluids and medicines.  You will receive medicines to relax you and medicines that make you sleep (general anesthetic).  You may have a flexible tube (catheter) put into your bladder to drain urine.  You may have a tube put through your nose or mouth that goes into your stomach (nasogastric tube). The nasogastric tube removes digestive fluids and prevents you from feeling nauseous  and vomiting.  Tight fitting (compression) stockings will be placed on your legs to promote circulation.  Three to four small incisions will be made in the abdomen. An incision is also made in the vagina. Probes and tools are inserted into the small incisions. The uterus and cervix are removed (and possibly your ovaries and fallopian tubes) through the vagina, as well as through the 3 to 4 small incisions that were made in the abdomen.  The vagina is then sewn back to normal.  The procedure takes about 1 to 3 hours. AFTER THE PROCEDURE  Plan to be in the hospital for 1 to 2 days.  You may have abdominal cramping and a sore throat. Your pain will be controlled with medicine.  You may have a liquid diet temporarily. You will most likely return to, and tolerate, your usual diet the day after surgery.  You will be passing urine through a catheter. It will be removed the day after surgery.  Your temperature, breathing rate, heart rate, blood pressure, and oxygen level will be monitored regularly.  You will still  wear compression stockings on your legs until you are able to move around.  You will use a special device or do breathing exercises to keep your lungs clear.  You will be encouraged to walk as soon as possible.  Expect a full recovery in 4 to 6 weeks after surgery. Document Released: 08/24/2011 Document Revised: 11/27/2011 Document Reviewed: 08/24/2011 [facility] Patient Information 2014 [place], [place].

## 2013-02-20 NOTE — Progress Notes (Addendum)
Subjective:     Patient ID: [name], female   DOB: [birth year], 47 y.o.   MRN: [id number]  HPI pt with symptomatic uterine fibroids.  She has received some benefit from the Megace but, does not want to be on meds long term and  She is still having bleeding between cycles. Pt has pain assoc with her cycles.  Past Medical History  Diagnosis Date  . Asthma    Past Surgical History  Procedure Laterality Date  . Cesarean section  1995   Current Outpatient Prescriptions on File Prior to Visit  Medication Sig Dispense Refill  . albuterol (PROVENTIL HFA;VENTOLIN HFA) 108 (90 BASE) MCG/ACT inhaler Inhale 1-2 puffs into the lungs every 6 (six) hours as needed for wheezing.  1 Inhaler  0  . ibuprofen (ADVIL,MOTRIN) 800 MG tablet Take 1 tablet (800 mg total) by mouth 3 (three) times daily.  21 tablet  0  . megestrol (MEGACE) 40 MG tablet Take 1 tablet (40 mg total) by mouth 2 (two) times daily. For bleeding- take until your clinic appointment  60 tablet  0   No current facility-administered medications on file prior to visit.   No Known Allergies History   Social History  . Marital Status: Married    Spouse Name: N/A    Number of Children: N/A  . Years of Education: N/A   Occupational History  . Not on file.   Social History Main Topics  . Smoking status: Never Smoker   . Smokeless tobacco: Not on file  . Alcohol Use: No  . Drug Use: No  . Sexually Active: No   Other Topics Concern  . Not on file   Social History Narrative  . No narrative on file   No family history on file.     Review of Systems  [place]     Objective:   Physical Exam BP 121/78  Pulse 84  Temp(Src) 98.3 F (36.8 C) (Oral)  Ht 5' (1.524 m)  Wt 186 lb (84.369 kg)  BMI 36.33 kg/m2  LMP 01/20/2013 Exam deferred She is accompanied by her adult son.  Pt speaks English as her first language.  (she has a heavy accent)     12/05/2012 TRANSABDOMINAL AND TRANSVAGINAL ULTRASOUND OF PELVIS  Technique:  Both transabdominal and transvaginal ultrasound  examinations of the pelvis were performed. Transabdominal technique  was performed for global imaging of the pelvis including uterus,  ovaries, adnexal regions, and pelvic cul-de-sac.  It was necessary to proceed with endovaginal exam following the  transabdominal exam to visualize the cervix.  Comparison: 2007 and CT 2012  Findings:  Uterus: Is anteverted and anteflexed and assessed well only  transabdominally due to uterine size resulting in poor visibility  on endovaginal exam. The uterus demonstrates a sagittal length of  approximately 16 cm, depth of approximately 6 cm and width of  approximately 10 cm. One partial subserosal fibroid is identified  associated with the anterior left upper uterine segment measuring  5.8 x 4.5 x 4.1 cm and a second partial subserosal fibroid is seen  emanating off of the fundal region measuring 2.8 x 1.9 x 2.5 cm.  The remainder of the myometrium is poorly evaluated and more  diffuse fibroid involvement may be present.  Endometrium: Is not adequately assessed either transabdominally or  endovaginally with this study  Right ovary: In the right adnexa there is a thin-walled cystic  lesion identified measuring 3.4 x 2.3 by 3.9 cm. A similar  appearance was noted  in the right adnexa on prior CT from 2012 and  this may represent a benign neoplastic process or hydrosalpinx. No  worrisome features are suggested transabdominally although the  assessment is slightly as it is seen only with the transabdominal  approach.  Left ovary: Measures 2.4 x 1.5 x 1.9 cm and has a normal  transabdominal appearance. This ovary is not seen endovaginally.  Other findings: No pelvic fluid is seen  IMPRESSION:  Enlarged uterus with overall exam compromised due to the large  uterine size resulting in poor endovaginal evaluation. At least  two measurable fibroids are seen transabdominally. Right adnexal  cystic lesion with no  obvious worrisome features and similar in  appearance to findings in the right adnexa on prior CT from 2012.  If this is a persistent lesion from 2012 this would suggest a  benign etiology. Alternatively this could be new functional lesion  on this side in this premenopausal patient.  Given the overall poor visibility of the uterus endovaginally and  history of menorrhagia, if more detailed evaluation of the uterus  and the endometrium is desired, pelvic MRI could be performed .  11/2012 Diagnosis Endometrium, biopsy - POLYPOID PROLIFERATIVE PHASE ENDOMETRIUM WITH BREAKDOWN. - NEGATIVE FOR HYPERPLASIA OR MALIGNANCY. - BENIGN ENDOCERVICAL MUCOSA - DETACHED FRAGMENTS OF BENIGN SQUAMOUS EPITHELIUM; NEGATIVE FOR INTRAEPITHELIAL LESION OR MALIGNANCY.     Assessment:      large symptomatic uterine fibroids.  Pt wants definitve treatment.  Reviewed options with her regarding surgery.  She was also told that I will not be operating in July.  She will wait until Aug to have a robot assisted TLH.  She son is with her and also recommended surgery for her.    Plan:     Patient desires surgical management with robot assisted total laparoscopic hysterectomy with bilateral salpingectomy.  The risks of surgery were discussed in detail with the patient including but not limited to: bleeding which may require transfusion or reoperation; infection which may require prolonged hospitalization or re-hospitalization and antibiotic therapy; injury to bowel, bladder, ureters and major vessels or other surrounding organs; need for additional procedures including laparotomy; thromboembolic phenomenon, incisional problems and other postoperative or anesthesia complications.  Patient was told that the likelihood that her condition and symptoms will be treated effectively with this surgical management was very high; the postoperative expectations were also discussed in detail. The patient also understands the alternative  treatment options which were discussed in full. All questions were answered.  She was told that she will be contacted by our surgical scheduler regarding the time and date of her surgery; routine preoperative instructions of having nothing to eat or drink after midnight on the day prior to surgery and also coming to the hospital 1 1/2 hours prior to her time of surgery were also emphasized.  She was told she may be called for a preoperative appointment about a week prior to surgery and will be given further preoperative instructions at that visit. Printed patient education handouts about the procedure were given to the patient to review at home.   Orthocyclen 1 po q day D/c Megace

## 2013-02-23 ENCOUNTER — Encounter: Payer: Self-pay | Admitting: Obstetrics & Gynecology

## 2013-02-26 ENCOUNTER — Telehealth: Payer: Self-pay | Admitting: General Practice

## 2013-02-26 ENCOUNTER — Encounter: Payer: Self-pay | Admitting: General Practice

## 2013-02-26 NOTE — Telephone Encounter (Signed)
Called patient and informed her of recommendations- patient seemed unclear if she understood or not and asked [place] to call back later. Will send patient a letter stating information

## 2013-02-26 NOTE — Telephone Encounter (Signed)
Message copied by Kathee Delton on Wed Feb 26, 2013  4:48 PM ------      Message from: Willodean Rosenthal      Created: Sun Feb 23, 2013 11:44 AM       Please call pt. On her last visit I added Orthocyclen.  She needs to take the OCP's but, STOP the Megace.  (not sure she was clear on that!)            Thx,      clh-S ------

## 2013-05-01 ENCOUNTER — Other Ambulatory Visit (HOSPITAL_COMMUNITY)
Admission: RE | Admit: 2013-05-01 | Discharge: 2013-05-01 | Disposition: A | Discharge status: Home / Self Care | Payer: Medicaid Other | Source: Ambulatory Visit | Attending: Obstetrics & Gynecology | Admitting: Obstetrics & Gynecology

## 2013-05-01 ENCOUNTER — Encounter: Payer: Self-pay | Admitting: Obstetrics & Gynecology

## 2013-05-01 ENCOUNTER — Ambulatory Visit: Payer: Medicaid Other | Admitting: Obstetrics & Gynecology

## 2013-05-01 ENCOUNTER — Ambulatory Visit (INDEPENDENT_AMBULATORY_CARE_PROVIDER_SITE_OTHER): Payer: Medicaid Other | Admitting: Obstetrics & Gynecology

## 2013-05-01 VITALS — BP 124/73 | HR 84 | Temp 99.3°F | Ht 60.5 in | Wt 188.9 lb

## 2013-05-01 DIAGNOSIS — Z1151 Encounter for screening for human papillomavirus (HPV): Secondary | ICD-10-CM | POA: Insufficient documentation

## 2013-05-01 DIAGNOSIS — Z124 Encounter for screening for malignant neoplasm of cervix: Secondary | ICD-10-CM

## 2013-05-01 DIAGNOSIS — N920 Excessive and frequent menstruation with regular cycle: Secondary | ICD-10-CM

## 2013-05-01 DIAGNOSIS — Z01419 Encounter for gynecological examination (general) (routine) without abnormal findings: Secondary | ICD-10-CM | POA: Insufficient documentation

## 2013-05-01 NOTE — Progress Notes (Signed)
Here for preop appointment.  C/o head pain comes and go, right flank pain.

## 2013-05-01 NOTE — Patient Instructions (Addendum)

## 2013-05-02 NOTE — Progress Notes (Signed)
Subjective:     Patient ID: [name], female   DOB: 06/06/1966, 47 y.o.   MRN: [id number]  HPI Pt presents for her preop assessment.  She still c/o irreg menses- she reports that the last few days the bleeding has resolved.  She reports occ pain.  Pt is not sure when she had a prev PAP or if she's ever had a PAP. S/p endobx 12/11/2012  Past Medical History  Diagnosis Date  . Asthma    Past Surgical History  Procedure Laterality Date  . Cesarean section  1995  prev c-section x 2  Current Outpatient Prescriptions on File Prior to Visit  Medication Sig Dispense Refill  . albuterol (PROVENTIL HFA;VENTOLIN HFA) 108 (90 BASE) MCG/ACT inhaler Inhale 1-2 puffs into the lungs every 6 (six) hours as needed for wheezing.  1 Inhaler  0  . ibuprofen (ADVIL,MOTRIN) 800 MG tablet Take 1 tablet (800 mg total) by mouth 3 (three) times daily.  21 tablet  0  . megestrol (MEGACE) 40 MG tablet Take 1 tablet (40 mg total) by mouth 2 (two) times daily. For bleeding- take until your clinic appointment  60 tablet  0  . norgestimate-ethinyl estradiol (ORTHO-CYCLEN,SPRINTEC,PREVIFEM) 0.25-35 MG-MCG tablet Take 1 tablet by mouth daily.  1 Package  11   No current facility-administered medications on file prior to visit.      No Known Allergies   History   Social History  . Marital Status: Married    Spouse Name: N/A    Number of Children: N/A  . Years of Education: N/A   Occupational History  . Not on file.   Social History Main Topics  . Smoking status: Never Smoker   . Smokeless tobacco: Never Used  . Alcohol Use: No  . Drug Use: No  . Sexual Activity: No   Other Topics Concern  . Not on file   Social History Narrative  . No narrative on file   History reviewed. No pertinent family history.  Review of Systems     Objective:   Physical Exam BP 124/73  Pulse 84  Temp(Src) 99.3 F (37.4 C)  Ht 5' 0.5" (1.537 m)  Wt 188 lb 14.4 oz (85.684 kg)  BMI 36.27 kg/m2  LMP  07/23/2012  Lungs: CTA CV: RRR Abd: well healed vertical incision GU: EGBUS: no lesions Vagina: no blood in vault Cervix: no lesion; no mucopurulent d/c Uterus: mobile 12-14cm Adnexa: no masses   12/05/2012 Clinical Data: Menorrhagia. LMP 11/21/2012  TRANSABDOMINAL AND TRANSVAGINAL ULTRASOUND OF PELVIS  Technique: Both transabdominal and transvaginal ultrasound  examinations of the pelvis were performed. Transabdominal technique  was performed for global imaging of the pelvis including uterus,  ovaries, adnexal regions, and pelvic cul-de-sac.  It was necessary to proceed with endovaginal exam following the  transabdominal exam to visualize the cervix.  Comparison: 2007 and CT 2012  Findings:  Uterus: Is anteverted and anteflexed and assessed well only  transabdominally due to uterine size resulting in poor visibility  on endovaginal exam. The uterus demonstrates a sagittal length of  approximately 16 cm, depth of approximately 6 cm and width of  approximately 10 cm. One partial subserosal fibroid is identified  associated with the anterior left upper uterine segment measuring  5.8 x 4.5 x 4.1 cm and a second partial subserosal fibroid is seen  emanating off of the fundal region measuring 2.8 x 1.9 x 2.5 cm.  The remainder of the myometrium is poorly evaluated and more  diffuse fibroid  involvement may be present.  Endometrium: Is not adequately assessed either transabdominally or  endovaginally with this study  Right ovary: In the right adnexa there is a thin-walled cystic  lesion identified measuring 3.4 x 2.3 by 3.9 cm. A similar  appearance was noted in the right adnexa on prior CT from 2012 and  this may represent a benign neoplastic process or hydrosalpinx. No  worrisome features are suggested transabdominally although the  assessment is slightly as it is seen only with the transabdominal  approach.  Left ovary: Measures 2.4 x 1.5 x 1.9 cm and has a normal   transabdominal appearance. This ovary is not seen endovaginally.  Other findings: No pelvic fluid is seen  IMPRESSION:  Enlarged uterus with overall exam compromised due to the large  uterine size resulting in poor endovaginal evaluation. At least  two measurable fibroids are seen transabdominally. Right adnexal  cystic lesion with no obvious worrisome features and similar in  appearance to findings in the right adnexa on prior CT from 2012.  If this is a persistent lesion from 2012 this would suggest a  benign etiology. Alternatively this could be new functional lesion  on this side in this premenopausal patient.  Given the overall poor visibility of the uterus endovaginally and  history of menorrhagia, if more detailed evaluation of the uterus  and the endometrium is desired, pelvic MRI could be performed .   12/12/2012 Diagnosis Endometrium, biopsy - POLYPOID PROLIFERATIVE PHASE ENDOMETRIUM WITH BREAKDOWN. - NEGATIVE FOR HYPERPLASIA OR MALIGNANCY. - BENIGN ENDOCERVICAL MUCOSA - DETACHED FRAGMENTS OF BENIGN SQUAMOUS EPITHELIUM; NEGATIVE FOR INTRAEPITHELIAL LESION OR MALIGNANCY.        Assessment:     47yo multigravid female with menorrhagia and pelvic pain.  Pt scheduled for surgery       Plan:     Patient desires surgical management with robotic assisted total laparoscopic hysterectomy with bilateral salpingectomy.  The risks of surgery were discussed in detail with the patient including but not limited to: bleeding which may require transfusion or reoperation; infection which may require prolonged hospitalization or re-hospitalization and antibiotic therapy; injury to bowel, bladder, ureters and major vessels or other surrounding organs; need for additional procedures including laparotomy; thromboembolic phenomenon, incisional problems and other postoperative or anesthesia complications.  Patient was told that the likelihood that her condition and symptoms will be treated  effectively with this surgical management was very high; the postoperative expectations were also discussed in detail. The patient also understands the alternative treatment options which were discussed in full. All questions were answered.  She was told that she will be contacted by our surgical scheduler regarding the time and date of her surgery; routine preoperative instructions of having nothing to eat or drink after midnight on the day prior to surgery and also coming to the hospital 1 1/2 hours prior to her time of surgery were also emphasized.  She was told she may be called for a preoperative appointment about a week prior to surgery and will be given further preoperative instructions at that visit. Printed patient education handouts about the procedure were given to the patient to review at home.

## 2013-05-05 ENCOUNTER — Telehealth: Payer: Self-pay

## 2013-05-05 ENCOUNTER — Other Ambulatory Visit: Payer: Self-pay | Admitting: Obstetrics & Gynecology

## 2013-05-05 DIAGNOSIS — A599 Trichomoniasis, unspecified: Secondary | ICD-10-CM

## 2013-05-05 MED ORDER — METRONIDAZOLE 500 MG PO TABS: ORAL_TABLET | ORAL | Status: DC

## 2013-05-05 NOTE — Telephone Encounter (Signed)
Called pt and told the pt's daughter that her mother has a Rx at her [facility] pharmacy and that she needs to take medication.  Pts daughter stated understanding.

## 2013-05-05 NOTE — Telephone Encounter (Signed)
Message copied by Faythe Casa on Mon May 05, 2013  4:50 PM ------      Message from: Willodean Rosenthal      Created: Mon May 05, 2013  4:40 PM       Please call pt.  She needs Flagyl for trich.  She needs to take ASAP as she is scheduled for surgery.            clh-S ------

## 2013-05-06 ENCOUNTER — Encounter (HOSPITAL_COMMUNITY): Payer: Self-pay

## 2013-05-06 ENCOUNTER — Encounter (HOSPITAL_COMMUNITY)
Admission: RE | Admit: 2013-05-06 | Discharge: 2013-05-06 | Disposition: A | Discharge status: Home / Self Care | Payer: Medicaid Other | Source: Ambulatory Visit | Attending: Obstetrics & Gynecology | Admitting: Obstetrics & Gynecology

## 2013-05-06 DIAGNOSIS — Z01812 Encounter for preprocedural laboratory examination: Secondary | ICD-10-CM | POA: Insufficient documentation

## 2013-05-06 DIAGNOSIS — Z01818 Encounter for other preprocedural examination: Secondary | ICD-10-CM | POA: Insufficient documentation

## 2013-05-06 HISTORY — DX: Shortness of breath: R06.02

## 2013-05-06 HISTORY — DX: Unspecified osteoarthritis, unspecified site: M19.90

## 2013-05-06 HISTORY — DX: Cardiac arrhythmia, unspecified: I49.9

## 2013-05-06 HISTORY — DX: Headache: R51

## 2013-05-06 LAB — CBC
MCH: 20 pg — ABNORMAL LOW (ref 26.0–34.0)
MCHC: 29.3 g/dL — ABNORMAL LOW (ref 30.0–36.0)
MCV: 68.2 fL — ABNORMAL LOW (ref 78.0–100.0)
Platelets: 332 10*3/uL (ref 150–400)
RBC: 3.8 MIL/uL — ABNORMAL LOW (ref 3.87–5.11)
RDW: 18.1 % — ABNORMAL HIGH (ref 11.5–15.5)

## 2013-05-06 NOTE — Patient Instructions (Addendum)
Your procedure is scheduled on:05/15/13  Enter through the Main Entrance at :6am Pick up desk phone and dial [phone] and inform us of your arrival.  Please call [phone] if you have any problems the morning of surgery.  Remember: Do not eat food or drink liquids, including water, after midnight:WED  You may brush your teeth the morning of surgery.  Take these meds the morning of surgery with a sip of water:usual morning pills  BRING INHALER TO HOSPITAL  DO NOT wear jewelry, eye make-up, lipstick,body lotion, or dark fingernail polish.  (Polished toes are ok) You may wear deodorant.  If you are to be admitted after surgery, leave suitcase in car until your room has been assigned. Patients discharged on the day of surgery will not be allowed to drive home. Wear loose fitting, comfortable clothes for your ride home.

## 2013-05-07 NOTE — Pre-Procedure Instructions (Signed)
Dr. Sheral Apley notified of hgb result of 7.6. He advised to let surgeon know of result and suggested T&C with admission prior to surgery. I called Dr. Sarina Ser office and gave info to [facility].

## 2013-05-15 ENCOUNTER — Ambulatory Visit (HOSPITAL_COMMUNITY): Payer: Medicaid Other | Admitting: Anesthesiology

## 2013-05-15 ENCOUNTER — Encounter (HOSPITAL_COMMUNITY)
Admission: RE | Disposition: A | Discharge status: Home / Self Care | Payer: Self-pay | Source: Ambulatory Visit | Attending: Obstetrics & Gynecology

## 2013-05-15 ENCOUNTER — Inpatient Hospital Stay (HOSPITAL_COMMUNITY)
Admission: RE | Admit: 2013-05-15 | Discharge: 2013-05-17 | DRG: 743 | Disposition: A | Discharge status: Home / Self Care | Payer: Medicaid Other | Source: Ambulatory Visit | Attending: Obstetrics & Gynecology | Admitting: Obstetrics & Gynecology

## 2013-05-15 ENCOUNTER — Encounter (HOSPITAL_COMMUNITY): Payer: Self-pay | Admitting: Anesthesiology

## 2013-05-15 DIAGNOSIS — N920 Excessive and frequent menstruation with regular cycle: Secondary | ICD-10-CM | POA: Diagnosis present

## 2013-05-15 DIAGNOSIS — N852 Hypertrophy of uterus: Secondary | ICD-10-CM | POA: Diagnosis present

## 2013-05-15 DIAGNOSIS — D649 Anemia, unspecified: Secondary | ICD-10-CM

## 2013-05-15 DIAGNOSIS — D250 Submucous leiomyoma of uterus: Secondary | ICD-10-CM | POA: Diagnosis present

## 2013-05-15 DIAGNOSIS — Z5331 Laparoscopic surgical procedure converted to open procedure: Secondary | ICD-10-CM

## 2013-05-15 DIAGNOSIS — D251 Intramural leiomyoma of uterus: Secondary | ICD-10-CM | POA: Diagnosis present

## 2013-05-15 DIAGNOSIS — Z9889 Other specified postprocedural states: Secondary | ICD-10-CM

## 2013-05-15 DIAGNOSIS — D252 Subserosal leiomyoma of uterus: Principal | ICD-10-CM | POA: Diagnosis present

## 2013-05-15 DIAGNOSIS — N7013 Chronic salpingitis and oophoritis: Secondary | ICD-10-CM | POA: Diagnosis present

## 2013-05-15 DIAGNOSIS — N83209 Unspecified ovarian cyst, unspecified side: Secondary | ICD-10-CM | POA: Diagnosis present

## 2013-05-15 DIAGNOSIS — N926 Irregular menstruation, unspecified: Secondary | ICD-10-CM | POA: Diagnosis present

## 2013-05-15 HISTORY — PX: ABDOMINAL HYSTERECTOMY: SHX81

## 2013-05-15 HISTORY — PX: ROBOTIC ASSISTED TOTAL HYSTERECTOMY: SHX6085

## 2013-05-15 LAB — CBC
HCT: 30.3 % — ABNORMAL LOW (ref 36.0–46.0)
MCH: 19.4 pg — ABNORMAL LOW (ref 26.0–34.0)
MCH: 21.8 pg — ABNORMAL LOW (ref 26.0–34.0)
MCHC: 28.9 g/dL — ABNORMAL LOW (ref 30.0–36.0)
MCV: 71 fL — ABNORMAL LOW (ref 78.0–100.0)
Platelets: 224 10*3/uL (ref 150–400)
RBC: 4.27 MIL/uL (ref 3.87–5.11)
RDW: 18.7 % — ABNORMAL HIGH (ref 11.5–15.5)
WBC: 11.9 10*3/uL — ABNORMAL HIGH (ref 4.0–10.5)

## 2013-05-15 SURGERY — ROBOTIC ASSISTED TOTAL HYSTERECTOMY
Anesthesia: General | Site: Abdomen | Wound class: Clean Contaminated

## 2013-05-15 MED ORDER — PROPOFOL 10 MG/ML IV EMUL
INTRAVENOUS | Status: AC
  Filled 2013-05-15: qty 40

## 2013-05-15 MED ORDER — LACTATED RINGERS IV SOLN
INTRAVENOUS | Status: DC
  Administered 2013-05-15 (×3): via INTRAVENOUS

## 2013-05-15 MED ORDER — POLYETHYLENE GLYCOL 3350 17 G PO PACK
17.0000 g | PACK | Freq: Every day | ORAL | Status: DC | PRN
  Administered 2013-05-17 (×2): 17 g via ORAL
  Filled 2013-05-15: qty 1

## 2013-05-15 MED ORDER — PANTOPRAZOLE SODIUM 40 MG IV SOLR
40.0000 mg | Freq: Every day | INTRAVENOUS | Status: DC
  Administered 2013-05-15: 40 mg via INTRAVENOUS
  Filled 2013-05-15 (×2): qty 40

## 2013-05-15 MED ORDER — ROCURONIUM BROMIDE 100 MG/10ML IV SOLN
INTRAVENOUS | Status: DC | PRN
  Administered 2013-05-15: 20 mg via INTRAVENOUS
  Administered 2013-05-15: 50 mg via INTRAVENOUS

## 2013-05-15 MED ORDER — 0.9 % SODIUM CHLORIDE (POUR BTL) OPTIME
TOPICAL | Status: DC | PRN
  Administered 2013-05-15: 1000 mL

## 2013-05-15 MED ORDER — CEFAZOLIN SODIUM-DEXTROSE 2-3 GM-% IV SOLR: 2.0000 g | INTRAVENOUS | Status: DC

## 2013-05-15 MED ORDER — ONDANSETRON HCL 4 MG PO TABS: 4.0000 mg | ORAL_TABLET | Freq: Four times a day (QID) | ORAL | Status: DC | PRN

## 2013-05-15 MED ORDER — MORPHINE SULFATE (PF) 1 MG/ML IV SOLN
INTRAVENOUS | Status: DC
  Administered 2013-05-15: 9 mL via INTRAVENOUS
  Administered 2013-05-15: 13.5 mg via INTRAVENOUS
  Administered 2013-05-15: 15:00:00 via INTRAVENOUS
  Administered 2013-05-16 (×2): 1.5 mg via INTRAVENOUS
  Filled 2013-05-15: qty 25

## 2013-05-15 MED ORDER — NALOXONE HCL 0.4 MG/ML IJ SOLN: 0.4000 mg | INTRAMUSCULAR | Status: DC | PRN

## 2013-05-15 MED ORDER — PHENYLEPHRINE 40 MCG/ML (10ML) SYRINGE FOR IV PUSH (FOR BLOOD PRESSURE SUPPORT)
PREFILLED_SYRINGE | INTRAVENOUS | Status: AC
  Filled 2013-05-15: qty 5

## 2013-05-15 MED ORDER — ROCURONIUM BROMIDE 50 MG/5ML IV SOLN
INTRAVENOUS | Status: AC
  Filled 2013-05-15: qty 1

## 2013-05-15 MED ORDER — KETOROLAC TROMETHAMINE 30 MG/ML IJ SOLN: 15.0000 mg | Freq: Once | INTRAMUSCULAR | Status: DC | PRN

## 2013-05-15 MED ORDER — PROMETHAZINE HCL 25 MG/ML IJ SOLN: 6.2500 mg | INTRAMUSCULAR | Status: DC | PRN

## 2013-05-15 MED ORDER — NEOSTIGMINE METHYLSULFATE 1 MG/ML IJ SOLN
INTRAMUSCULAR | Status: AC
  Filled 2013-05-15: qty 1

## 2013-05-15 MED ORDER — LACTATED RINGERS IV SOLN: INTRAVENOUS | Status: DC

## 2013-05-15 MED ORDER — HYDROMORPHONE HCL PF 1 MG/ML IJ SOLN
INTRAMUSCULAR | Status: AC
  Administered 2013-05-15: 0.5 mg via INTRAVENOUS
  Filled 2013-05-15: qty 1

## 2013-05-15 MED ORDER — SODIUM CHLORIDE 0.9 % IJ SOLN: 9.0000 mL | INTRAMUSCULAR | Status: DC | PRN

## 2013-05-15 MED ORDER — NEOSTIGMINE METHYLSULFATE 1 MG/ML IJ SOLN
INTRAMUSCULAR | Status: DC | PRN
  Administered 2013-05-15: 4 mg via INTRAVENOUS

## 2013-05-15 MED ORDER — BUPIVACAINE HCL (PF) 0.5 % IJ SOLN
INTRAMUSCULAR | Status: DC | PRN
  Administered 2013-05-15 (×2): 30 mL

## 2013-05-15 MED ORDER — STERILE WATER FOR IRRIGATION IR SOLN
Status: DC | PRN
  Administered 2013-05-15: 1000 mL via INTRAVESICAL

## 2013-05-15 MED ORDER — BUPIVACAINE HCL (PF) 0.5 % IJ SOLN
INTRAMUSCULAR | Status: AC
  Filled 2013-05-15: qty 30

## 2013-05-15 MED ORDER — FENTANYL CITRATE 0.05 MG/ML IJ SOLN
INTRAMUSCULAR | Status: AC
  Filled 2013-05-15: qty 5

## 2013-05-15 MED ORDER — MIDAZOLAM HCL 2 MG/2ML IJ SOLN
INTRAMUSCULAR | Status: AC
  Filled 2013-05-15: qty 2

## 2013-05-15 MED ORDER — PNEUMOCOCCAL VAC POLYVALENT 25 MCG/0.5ML IJ INJ
0.5000 mL | INJECTION | INTRAMUSCULAR | Status: AC
  Administered 2013-05-16: 0.5 mL via INTRAMUSCULAR
  Filled 2013-05-15: qty 0.5

## 2013-05-15 MED ORDER — MENTHOL 3 MG MT LOZG: 1.0000 | LOZENGE | OROMUCOSAL | Status: DC | PRN

## 2013-05-15 MED ORDER — DEXTROSE-NACL 5-0.45 % IV SOLN
INTRAVENOUS | Status: DC
  Administered 2013-05-15 – 2013-05-16 (×3): via INTRAVENOUS

## 2013-05-15 MED ORDER — ALBUTEROL SULFATE HFA 108 (90 BASE) MCG/ACT IN AERS
1.0000 | INHALATION_SPRAY | Freq: Four times a day (QID) | RESPIRATORY_TRACT | Status: DC | PRN
  Administered 2013-05-16: 2 via RESPIRATORY_TRACT
  Filled 2013-05-15: qty 6.7

## 2013-05-15 MED ORDER — SODIUM CHLORIDE 0.9 % IV SOLN
INTRAVENOUS | Status: DC | PRN
  Administered 2013-05-15: 09:00:00 via INTRAVENOUS

## 2013-05-15 MED ORDER — GLYCOPYRROLATE 0.2 MG/ML IJ SOLN
INTRAMUSCULAR | Status: DC | PRN
  Administered 2013-05-15: 0.6 mg via INTRAVENOUS

## 2013-05-15 MED ORDER — ONDANSETRON HCL 4 MG/2ML IJ SOLN
INTRAMUSCULAR | Status: DC | PRN
  Administered 2013-05-15: 4 mg via INTRAVENOUS

## 2013-05-15 MED ORDER — GLYCOPYRROLATE 0.2 MG/ML IJ SOLN
INTRAMUSCULAR | Status: AC
  Filled 2013-05-15: qty 1

## 2013-05-15 MED ORDER — OXYCODONE-ACETAMINOPHEN 5-325 MG PO TABS
1.0000 | ORAL_TABLET | ORAL | Status: DC | PRN
Start: 2013-05-15 — End: 2013-05-17
  Administered 2013-05-16: 1 via ORAL
  Administered 2013-05-17 (×2): 2 via ORAL
  Filled 2013-05-15: qty 1
  Filled 2013-05-15 (×2): qty 2

## 2013-05-15 MED ORDER — PHENYLEPHRINE HCL 10 MG/ML IJ SOLN
INTRAMUSCULAR | Status: DC | PRN
  Administered 2013-05-15: 80 ug via INTRAVENOUS

## 2013-05-15 MED ORDER — DIPHENHYDRAMINE HCL 50 MG/ML IJ SOLN: 12.5000 mg | Freq: Four times a day (QID) | INTRAMUSCULAR | Status: DC | PRN

## 2013-05-15 MED ORDER — ONDANSETRON HCL 4 MG/2ML IJ SOLN: 4.0000 mg | Freq: Four times a day (QID) | INTRAMUSCULAR | Status: DC | PRN

## 2013-05-15 MED ORDER — LIDOCAINE HCL (CARDIAC) 20 MG/ML IV SOLN
INTRAVENOUS | Status: DC | PRN
  Administered 2013-05-15: 100 mg via INTRAVENOUS

## 2013-05-15 MED ORDER — SIMETHICONE 80 MG PO CHEW: 80.0000 mg | CHEWABLE_TABLET | Freq: Four times a day (QID) | ORAL | Status: DC | PRN

## 2013-05-15 MED ORDER — ONDANSETRON HCL 4 MG/2ML IJ SOLN
INTRAMUSCULAR | Status: AC
  Filled 2013-05-15: qty 2

## 2013-05-15 MED ORDER — BUPIVACAINE LIPOSOME 1.3 % IJ SUSP
20.0000 mL | Freq: Once | INTRAMUSCULAR | Status: DC
  Filled 2013-05-15: qty 20

## 2013-05-15 MED ORDER — FENTANYL CITRATE 0.05 MG/ML IJ SOLN
INTRAMUSCULAR | Status: DC | PRN
  Administered 2013-05-15: 100 ug via INTRAVENOUS

## 2013-05-15 MED ORDER — BUPIVACAINE HCL (PF) 0.25 % IJ SOLN: INTRAMUSCULAR | Status: DC | PRN

## 2013-05-15 MED ORDER — HYDROMORPHONE HCL PF 1 MG/ML IJ SOLN
0.2500 mg | INTRAMUSCULAR | Status: DC | PRN
  Administered 2013-05-15 (×2): 0.5 mg via INTRAVENOUS

## 2013-05-15 MED ORDER — SODIUM CHLORIDE 0.9 % IJ SOLN
INTRAMUSCULAR | Status: DC | PRN
  Administered 2013-05-15: 11:00:00

## 2013-05-15 MED ORDER — PROPOFOL 10 MG/ML IV BOLUS
INTRAVENOUS | Status: DC | PRN
  Administered 2013-05-15: 200 mg via INTRAVENOUS

## 2013-05-15 MED ORDER — DIPHENHYDRAMINE HCL 12.5 MG/5ML PO ELIX: 12.5000 mg | ORAL_SOLUTION | Freq: Four times a day (QID) | ORAL | Status: DC | PRN

## 2013-05-15 MED ORDER — MIDAZOLAM HCL 5 MG/5ML IJ SOLN
INTRAMUSCULAR | Status: DC | PRN
  Administered 2013-05-15: 2 mg via INTRAVENOUS

## 2013-05-15 MED ORDER — MORPHINE SULFATE 0.5 MG/ML IJ SOLN
INTRAMUSCULAR | Status: AC
  Filled 2013-05-15: qty 10

## 2013-05-15 MED ORDER — DOCUSATE SODIUM 100 MG PO CAPS
100.0000 mg | ORAL_CAPSULE | Freq: Two times a day (BID) | ORAL | Status: DC
  Administered 2013-05-15 – 2013-05-17 (×4): 100 mg via ORAL
  Filled 2013-05-15 (×4): qty 1

## 2013-05-15 MED ORDER — LACTATED RINGERS IR SOLN
Status: DC | PRN
  Administered 2013-05-15: 3000 mL

## 2013-05-15 MED ORDER — CEFAZOLIN SODIUM-DEXTROSE 2-3 GM-% IV SOLR
INTRAVENOUS | Status: AC
  Administered 2013-05-15: 2 g via INTRAVENOUS
  Filled 2013-05-15: qty 50

## 2013-05-15 MED ORDER — LIDOCAINE HCL (CARDIAC) 20 MG/ML IV SOLN
INTRAVENOUS | Status: AC
  Filled 2013-05-15: qty 5

## 2013-05-15 MED ORDER — MEPERIDINE HCL 25 MG/ML IJ SOLN: 6.2500 mg | INTRAMUSCULAR | Status: DC | PRN

## 2013-05-15 SURGICAL SUPPLY — 69 items
ADH SKN CLS APL DERMABOND .7 (GAUZE/BANDAGES/DRESSINGS) ×2
APL SKNCLS STERI-STRIP NONHPOA (GAUZE/BANDAGES/DRESSINGS) ×2
BAG URINE DRAINAGE (UROLOGICAL SUPPLIES) ×3 IMPLANT
BARRIER ADHS 3X4 INTERCEED (GAUZE/BANDAGES/DRESSINGS) ×1 IMPLANT
BENZOIN TINCTURE PRP APPL 2/3 (GAUZE/BANDAGES/DRESSINGS) ×3 IMPLANT
BRR ADH 4X3 ABS CNTRL BYND (GAUZE/BANDAGES/DRESSINGS) ×2
CATH FOLEY 3WAY  5CC 16FR (CATHETERS) ×1
CATH FOLEY 3WAY 5CC 16FR (CATHETERS) ×2 IMPLANT
CLOTH BEACON ORANGE TIMEOUT ST (SAFETY) ×3 IMPLANT
CONT PATH 16OZ SNAP LID 3702 (MISCELLANEOUS) ×3 IMPLANT
COVER MAYO STAND STRL (DRAPES) ×3 IMPLANT
COVER TABLE BACK 60X90 (DRAPES) ×6 IMPLANT
COVER TIP SHEARS 8 DVNC (MISCELLANEOUS) ×2 IMPLANT
COVER TIP SHEARS 8MM DA VINCI (MISCELLANEOUS) ×1
DECANTER SPIKE VIAL GLASS SM (MISCELLANEOUS) ×3 IMPLANT
DERMABOND ADVANCED (GAUZE/BANDAGES/DRESSINGS) ×1
DERMABOND ADVANCED .7 DNX12 (GAUZE/BANDAGES/DRESSINGS) ×2 IMPLANT
DEVICE TROCAR PUNCTURE CLOSURE (ENDOMECHANICALS) IMPLANT
DRAPE HUG U DISPOSABLE (DRAPE) ×3 IMPLANT
DRAPE LG THREE QUARTER DISP (DRAPES) ×6 IMPLANT
DRAPE WARM FLUID 44X44 (DRAPE) ×3 IMPLANT
DRESSING TELFA 8X3 (GAUZE/BANDAGES/DRESSINGS) ×1 IMPLANT
ELECT REM PT RETURN 9FT ADLT (ELECTROSURGICAL) ×3
ELECTRODE REM PT RTRN 9FT ADLT (ELECTROSURGICAL) ×2 IMPLANT
EVACUATOR SMOKE 8.L (FILTER) ×3 IMPLANT
GAUZE VASELINE 3X9 (GAUZE/BANDAGES/DRESSINGS) IMPLANT
GLOVE BIO SURGEON STRL SZ7 (GLOVE) ×6 IMPLANT
GLOVE BIOGEL PI IND STRL 7.0 (GLOVE) ×4 IMPLANT
GLOVE BIOGEL PI INDICATOR 7.0 (GLOVE) ×2
GOWN STRL REIN XL XLG (GOWN DISPOSABLE) ×18 IMPLANT
KIT ACCESSORY DA VINCI DISP (KITS) ×1
KIT ACCESSORY DVNC DISP (KITS) ×2 IMPLANT
LEGGING LITHOTOMY PAIR STRL (DRAPES) ×3 IMPLANT
NEEDLE INSUFFLATION 120MM (ENDOMECHANICALS) ×3 IMPLANT
OCCLUDER COLPOPNEUMO (BALLOONS) ×3 IMPLANT
PACK LAVH (CUSTOM PROCEDURE TRAY) ×3 IMPLANT
PAD ABD 7.5X8 STRL (GAUZE/BANDAGES/DRESSINGS) ×1 IMPLANT
PAD PREP 24X48 CUFFED NSTRL (MISCELLANEOUS) ×6 IMPLANT
PLUG CATH AND CAP STER (CATHETERS) ×3 IMPLANT
PROTECTOR NERVE ULNAR (MISCELLANEOUS) ×6 IMPLANT
SET CYSTO W/LG BORE CLAMP LF (SET/KITS/TRAYS/PACK) ×1 IMPLANT
SET IRRIG TUBING LAPAROSCOPIC (IRRIGATION / IRRIGATOR) ×3 IMPLANT
SOLUTION ELECTROLUBE (MISCELLANEOUS) ×3 IMPLANT
SPONGE GAUZE 4X4 12PLY (GAUZE/BANDAGES/DRESSINGS) ×1 IMPLANT
STRIP CLOSURE SKIN 1/2X4 (GAUZE/BANDAGES/DRESSINGS) ×3 IMPLANT
SURGIFLO W/THROMBIN 8M KIT (HEMOSTASIS) IMPLANT
SUT MNCRL AB 4-0 PS2 18 (SUTURE) ×6 IMPLANT
SUT VIC AB 0 CT1 27 (SUTURE) ×21
SUT VIC AB 0 CT1 27XBRD ANBCTR (SUTURE) ×4 IMPLANT
SUT VIC AB 0 CT1 27XBRD ANTBC (SUTURE) IMPLANT
SUT VICRYL 0 UR6 27IN ABS (SUTURE) ×6 IMPLANT
SUT VLOC 180 0 9IN  GS21 (SUTURE)
SUT VLOC 180 0 9IN GS21 (SUTURE) IMPLANT
SYR 50ML LL SCALE MARK (SYRINGE) ×3 IMPLANT
SYSTEM CONVERTIBLE TROCAR (TROCAR) ×1 IMPLANT
TIP RUMI ORANGE 6.7MMX12CM (TIP) ×1 IMPLANT
TIP UTERINE 5.1X6CM LAV DISP (MISCELLANEOUS) IMPLANT
TIP UTERINE 6.7X10CM GRN DISP (MISCELLANEOUS) IMPLANT
TIP UTERINE 6.7X6CM WHT DISP (MISCELLANEOUS) IMPLANT
TIP UTERINE 6.7X8CM BLUE DISP (MISCELLANEOUS) IMPLANT
TOWEL OR 17X24 6PK STRL BLUE (TOWEL DISPOSABLE) ×6 IMPLANT
TROCAR 12M 150ML BLUNT (TROCAR) ×3 IMPLANT
TROCAR DILATING TIP 12MM 150MM (ENDOMECHANICALS) ×3 IMPLANT
TROCAR DISP BLADELESS 8 DVNC (TROCAR) ×4 IMPLANT
TROCAR DISP BLADELESS 8MM (TROCAR) ×2
TROCAR XCEL 12X100 BLDLESS (ENDOMECHANICALS) IMPLANT
TROCAR XCEL NON-BLD 5MMX100MML (ENDOMECHANICALS) ×3 IMPLANT
TUBING FILTER THERMOFLATOR (ELECTROSURGICAL) ×3 IMPLANT
WATER STERILE IRR 1000ML POUR (IV SOLUTION) ×9 IMPLANT

## 2013-05-15 NOTE — Progress Notes (Signed)
UR completed 

## 2013-05-15 NOTE — Progress Notes (Signed)
Informed Ledon Snare, RN at the OR Desk that patient over slept but would be coming shortly.  Called Dr Erin Fulling back to inform her they would be coming.

## 2013-05-15 NOTE — Op Note (Signed)
PATIENT: [name] 47 y.o. female  PRE-OPERATIVE DIAGNOSIS: Uterine fibroids and history of menorrhagia  POST-OPERATIVE DIAGNOSIS: uterine fibroids  PROCEDURE: Procedure(s):  ATTEMPTED ROBOTIC TOTAL HYSTERECTOMY (N/A)  Subtotal HYSTERECTOMY ABDOMINAL WITH BILATERAL SALPINGECTOMY (Bilateral)  SURGEON: Surgeon(s) and Role:  * Willodean Rosenthal, MD - Primary  * Adam Phenix, MD - Assisting  ANESTHESIA: local and general  EBL: Total I/O  In: 3100 [I.V.:2400; Blood:700]  Out: 1250 [Urine:850; Blood:400]  BLOOD ADMINISTERED:2 units of PRBC's  DRAINS: none  LOCAL MEDICATIONS USED: OTHER Exparel and Marciane  SPECIMEN: Source of Specimen: uterus, fallopian tubes and part of cervix  DISPOSITION OF SPECIMEN: PATHOLOGY  COUNTS: YES  TOURNIQUET: * No tourniquets in log *  DICTATION: .Note written in EPIC  PLAN OF CARE: Admit to inpatient  PATIENT DISPOSITION: PACU - hemodynamically stable.  Delay start of Pharmacological VTE agent (>24hrs) due to surgical blood loss or risk of bleeding: yes   The risks, benefits, and alternatives of surgery were explained, understood, and accepted. Consents were signed. All questions were answered. She was taken to the operating room and general anesthesia was applied without complication. She was placed in the dorsal lithotomy position and her abdomen and vagina were prepped and draped after she had been carefully positioned on the table. A bimanual exam revealed a 14-16week size uterus that was mobile. Her adnexa were not enlarged. The cervix was measured and the uterus was sounded to 12 cm. A Rumi uterine manipulator was placed.  A Foley catheter was placed and it drained clear throughout the case. Gloves were changed and attention was turned to the abdomen. A small incision was made in the umbilicus and a Veress needle was placed intraperitoneally. CO2 was used to insufflate the abdomen to approximately 6 L. After good pneumoperitoneum was  established, a 12 mm trocar was placed approximately 10cm above the umbilicus.. Laparoscopy confirmed correct placement. She was placed in Trendelenburg position and ports were placed in appropriate positions on her abdomen to allow maximum exposure during the robotic case. Specifically there was an 5mm assistant port placed in the left lower quadrant under direct laproscopic visualization. Two 8 mm ports were placed 8cm lateral to the midline port.  These were all placed under direct laparoscopic visualization. The robot was docked and I proceeded with a robotic portion of the case.  The pelvis was inspected and the uterus was found to have multiple fibroids and was quite irregular in shape.  The fallopian tubes had a right hydrosalpinx.  The left ovary had a moderately sized simple cyst. The uterus was adherent to the anterior abdominal wall.  The ureter on the right side was identified.  The ureter on the left was not visible.  The infundibulopelvic ligaments were identified .The infundibulopelvic ligament on each side was cauterized and cut. The PK/gyrus instrument was used for this portion the round ligaments were identified, cauterized and ligated, an attempt was made to create a bladder flap but there was dense scar tissue that made this prohibitive without the great potential of bladder injury.    After part of the bladder was taken down, the broad ligament was transected and ligated serially using the PK/gyrus.  The uterine vessels were identified and cauterized and then cut.  A decision was made at this point to attempt an open supracervical case as the bladder could not safely be managed laparoscopically.  After determining excellent hemostasis, the robot was undocked.     The abdomen and perineum were prepped at the  onset of the procedure. A transverse skin incision was made. This incision was taken down to the fascia using a scalpel and cautery with care given to maintain good hemostasis. The fascia  was grasped with kocher clamps, tented up and the rectus muscles were dissected off using sharp and blunt dissection on the superior and inferior aspects of the fascial incision. The peritoneum entered sharply without complication. This peritoneal cavity was entered digitally. Attention was then turned to the pelvis. The uterus at this point was noted to be mobilized and was delivered up out of the abdomen. The bowel was packed away with moist laparotomy sponges. Of note, all sutures used in this procedure are 0 Vicryl unless otherwise noted. The anterior and posterior leaves of the broad ligament were separated, and the ureters were inspected to be safely away from the area of dissection bilaterally. The uterine arteries were skeletinized bilaterally.   A bladder flap was again attempted after back filling the bladder with NS.  The bladder and the cervix were densely adherent and to avoid injury to the bladder a partial supracervical hyst was performed.  The cervix was transected and good hemostasis noted.  The uterus was removed.  The top of the cervix and the vaginal cuff were closed with multiple figure-of-eight sutures.   At this point the fallopian tube on the left was grasped with a Babcock clamp.  Using a kelly clamp the mesosalpinx was clamped cut and suture ligated, excellent hemostasis was noted.  The pelvis was irrigated and hemostasis was reconfirmed at all pedicles and along the pelvic sidewall. All laparotomy sponges and instruments were removed from the abdomen. Surgicel was placed over the vaginal cuff and the pedicles after the pelvis was irrigated.  The peritoneum and rectus muscle was was reapproximated with 0 vicryl.  The fascia was closed with 0 vicryl .  The incision was closed with staples. The incision was injected with 20cc of Exparel mixed with 20cc of sterile saline.  The port sites were injected with 30cc of 0.5% marcaine.  The fascia of the  midline port was closed with 0 vicryl and the  skin was closed with 3-0 vicryl.  Fibrin glue was used to close the skin on the port sites.  Sponge, lap and needle counts were correct times two. The patient was taken to the recovery area awake, extubated and in stable condition.

## 2013-05-15 NOTE — Progress Notes (Signed)
Day of Surgery Procedure(s) (LRB): ATTEMPTED ROBOTIC TOTAL HYSTERECTOMY (N/A) HYSTERECTOMY ABDOMINAL WITH BILATERAL SALPINGECTOMY (Bilateral)  Subjective: Patient reports no problems.  Adequate pain control.  No nausea or emesis.    Objective: I have reviewed patient's vital signs, intake and output, medications and labs.  General: alert and mild distress GI: soft, ND, drressing dry CBC    Component Value Date/Time   WBC 11.9* 05/15/2013 1500   RBC 4.27 05/15/2013 1500   HGB 9.3* 05/15/2013 1500   HCT 30.3* 05/15/2013 1500   PLT 210 05/15/2013 1500   MCV 71.0* 05/15/2013 1500   MCH 21.8* 05/15/2013 1500   MCHC 30.7 05/15/2013 1500   RDW 20.3* 05/15/2013 1500   LYMPHSABS 1.8 12/05/2012 0247   MONOABS 0.3 12/05/2012 0247   EOSABS 0.0 12/05/2012 0247   BASOSABS 0.0 12/05/2012 0247     Assessment: s/p Procedure(s): ATTEMPTED ROBOTIC TOTAL HYSTERECTOMY (N/A) HYSTERECTOMY ABDOMINAL WITH BILATERAL SALPINGECTOMY (Bilateral): stable  Plan: Encourage ambulation Clear liquids CBC in am  LOS: 0 days    HARRAWAY-SMITH, Raygen Dahm 05/15/2013, 5:43 PM

## 2013-05-15 NOTE — H&P (Signed)
HPI Pt presents for surgery. She still c/o irreg menses- she reports a long time h/o bleeding leading to anemia. She reports occ pain. Had PAP 2 weeks prev which was normal. S/p endobx 12/11/2012  Past Medical History   Diagnosis  Date   .  Asthma     Past Surgical History   Procedure  Laterality  Date   .  Cesarean section   1995   prev c-section x 2  Current Outpatient Prescriptions on File Prior to Visit   Medication  Sig  Dispense  Refill   .  albuterol (PROVENTIL HFA;VENTOLIN HFA) 108 (90 BASE) MCG/ACT inhaler  Inhale 1-2 puffs into the lungs every 6 (six) hours as needed for wheezing.  1 Inhaler  0   .  ibuprofen (ADVIL,MOTRIN) 800 MG tablet  Take 1 tablet (800 mg total) by mouth 3 (three) times daily.  21 tablet  0   .  megestrol (MEGACE) 40 MG tablet  Take 1 tablet (40 mg total) by mouth 2 (two) times daily. For bleeding- take until your clinic appointment  60 tablet  0   .  norgestimate-ethinyl estradiol (ORTHO-CYCLEN,SPRINTEC,PREVIFEM) 0.25-35 MG-MCG tablet  Take 1 tablet by mouth daily.  1 Package  11    No current facility-administered medications on file prior to visit.   No Known Allergies  History    Social History   .  Marital Status:  Married     Spouse Name:  N/A     Number of Children:  N/A   .  Years of Education:  N/A    Occupational History   .  Not on file.    Social History Main Topics   .  Smoking status:  Never Smoker   .  Smokeless tobacco:  Never Used   .  Alcohol Use:  No   .  Drug Use:  No   .  Sexual Activity:  No    Other Topics  Concern   .  Not on file    Social History Narrative   .  No narrative on file   History reviewed. No pertinent family history.  Review of Systems  Objective:   Physical Exam BP 124/73  Pulse 84  Temp(Src) 99.3 F (37.4 C)  Ht 5' 0.5" (1.537 m)  Wt 188 lb 14.4 oz (85.684 kg)  BMI 36.27 kg/m2  LMP 07/23/2012  Lungs: CTA  CV: RRR  Abd: well healed vertical incision  GU:  EGBUS: no lesions  Vagina: no  blood in vault  Cervix: no lesion; no mucopurulent d/c  Uterus: mobile 12-14cm  Adnexa: no masses   CBC    Component Value Date/Time   WBC 5.4 05/15/2013 0700   RBC 3.81* 05/15/2013 0700   HGB 7.4* 05/15/2013 0700   HCT 25.6* 05/15/2013 0700   PLT 224 05/15/2013 0700   MCV 67.2* 05/15/2013 0700   MCH 19.4* 05/15/2013 0700   MCHC 28.9* 05/15/2013 0700   RDW 18.7* 05/15/2013 0700   LYMPHSABS 1.8 12/05/2012 0247   MONOABS 0.3 12/05/2012 0247   EOSABS 0.0 12/05/2012 0247   BASOSABS 0.0 12/05/2012 0247     12/05/2012  Clinical Data: Menorrhagia. LMP 11/21/2012  TRANSABDOMINAL AND TRANSVAGINAL ULTRASOUND OF PELVIS  Technique: Both transabdominal and transvaginal ultrasound  examinations of the pelvis were performed. Transabdominal technique  was performed for global imaging of the pelvis including uterus,  ovaries, adnexal regions, and pelvic cul-de-sac.  It was necessary to proceed with endovaginal exam following the  transabdominal exam to visualize the cervix.  Comparison: 2007 and CT 2012  Findings:  Uterus: Is anteverted and anteflexed and assessed well only  transabdominally due to uterine size resulting in poor visibility  on endovaginal exam. The uterus demonstrates a sagittal length of  approximately 16 cm, depth of approximately 6 cm and width of  approximately 10 cm. One partial subserosal fibroid is identified  associated with the anterior left upper uterine segment measuring  5.8 x 4.5 x 4.1 cm and a second partial subserosal fibroid is seen  emanating off of the fundal region measuring 2.8 x 1.9 x 2.5 cm.  The remainder of the myometrium is poorly evaluated and more  diffuse fibroid involvement may be present.  Endometrium: Is not adequately assessed either transabdominally or  endovaginally with this study  Right ovary: In the right adnexa there is a thin-walled cystic  lesion identified measuring 3.4 x 2.3 by 3.9 cm. A similar  appearance was noted in the right adnexa on  prior CT from 2012 and  this may represent a benign neoplastic process or hydrosalpinx. No  worrisome features are suggested transabdominally although the  assessment is slightly as it is seen only with the transabdominal  approach.  Left ovary: Measures 2.4 x 1.5 x 1.9 cm and has a normal  transabdominal appearance. This ovary is not seen endovaginally.  Other findings: No pelvic fluid is seen  IMPRESSION:  Enlarged uterus with overall exam compromised due to the large  uterine size resulting in poor endovaginal evaluation. At least  two measurable fibroids are seen transabdominally. Right adnexal  cystic lesion with no obvious worrisome features and similar in  appearance to findings in the right adnexa on prior CT from 2012.  If this is a persistent lesion from 2012 this would suggest a  benign etiology. Alternatively this could be new functional lesion  on this side in this premenopausal patient.  Given the overall poor visibility of the uterus endovaginally and  history of menorrhagia, if more detailed evaluation of the uterus  and the endometrium is desired, pelvic MRI could be performed .  12/12/2012  Diagnosis  Endometrium, biopsy  - POLYPOID PROLIFERATIVE PHASE ENDOMETRIUM WITH BREAKDOWN.  - NEGATIVE FOR HYPERPLASIA OR MALIGNANCY.  - BENIGN ENDOCERVICAL MUCOSA  - DETACHED FRAGMENTS OF BENIGN SQUAMOUS EPITHELIUM; NEGATIVE FOR INTRAEPITHELIAL  LESION OR MALIGNANCY.  Assessment:   47yo multigravid female with menorrhagia and pelvic pain. For Robot assisted total laparoscopic hysterectomy with bilateral salpingectomy  Plan:   Patient desires surgical management with robotic assisted total laparoscopic hysterectomy with bilateral salpingectomy. The risks of surgery were discussed in detail with the patient including but not limited to: bleeding which may require transfusion or reoperation; infection which may require prolonged hospitalization or re-hospitalization and antibiotic  therapy; injury to bowel, bladder, ureters and major vessels or other surrounding organs; need for additional procedures including laparotomy; thromboembolic phenomenon, incisional problems and other postoperative or anesthesia complications. Patient was told that the likelihood that her condition and symptoms will be treated effectively with this surgical management was very high; the postoperative expectations were also discussed in detail. The patient also understands the alternative treatment options which were discussed in full. All questions were answered by the patient and her daughter .

## 2013-05-15 NOTE — Anesthesia Postprocedure Evaluation (Signed)
Anesthesia Post Note  Patient: Press photographer  Procedure(s) Performed: Procedure(s) (LRB): ATTEMPTED ROBOTIC TOTAL HYSTERECTOMY (N/A) HYSTERECTOMY ABDOMINAL WITH BILATERAL SALPINGECTOMY (Bilateral)  Anesthesia type: General  Patient location: PACU  Post pain: Pain level controlled  Post assessment: Post-op Vital signs reviewed  Last Vitals:  Filed Vitals:   05/15/13 1300  BP:   Pulse: 70  Temp: 36.8 C  Resp: 18    Post vital signs: Reviewed  Level of consciousness: sedated  Complications: No apparent anesthesia complications

## 2013-05-15 NOTE — Progress Notes (Signed)
Called Dr Erin Fulling to inform her that her 730 am surgical patient had not arrived yet nor has the interpreter.

## 2013-05-15 NOTE — Brief Op Note (Signed)
05/15/2013  11:24 AM  PATIENT:  [name]  47 y.o. female  PRE-OPERATIVE DIAGNOSIS:  Uterine fibroids and history of menorrhagia  POST-OPERATIVE DIAGNOSIS:  uterine fibroids  PROCEDURE:  Procedure(s): ATTEMPTED ROBOTIC TOTAL HYSTERECTOMY (N/A) HYSTERECTOMY ABDOMINAL WITH BILATERAL SALPINGECTOMY (Bilateral)  SURGEON:  Surgeon(s) and Role:    * Willodean Rosenthal, MD - Primary    * Adam Phenix, MD - Assisting  ANESTHESIA:   local and general  EBL:  Total I/O In: 3100 [I.V.:2400; Blood:700] Out: 1250 [Urine:850; Blood:400]  BLOOD ADMINISTERED:2 uinte PRBC's  DRAINS: none   LOCAL MEDICATIONS USED:  OTHER Exparel and Marciane  SPECIMEN:  Source of Specimen:  uterus, fallopian tubes and part of cervix   DISPOSITION OF SPECIMEN:  PATHOLOGY  COUNTS:  YES  TOURNIQUET:  * No tourniquets in log *  DICTATION: .Note written in EPIC  PLAN OF CARE: Admit to inpatient   PATIENT DISPOSITION:  PACU - hemodynamically stable.   Delay start of Pharmacological VTE agent (>24hrs) due to surgical blood loss or risk of bleeding: yes

## 2013-05-15 NOTE — Anesthesia Preprocedure Evaluation (Addendum)
Anesthesia Evaluation  Patient identified by MRN, date of birth, ID band Patient awake    Reviewed: Allergy & Precautions, H&P , NPO status , Patient's Chart, lab work & pertinent test results  Airway Mallampati: III TM Distance: >3 FB Neck ROM: full    Dental no notable dental hx.    Pulmonary    Pulmonary exam normal       Cardiovascular negative cardio ROS      Neuro/Psych negative psych ROS   GI/Hepatic negative GI ROS, Neg liver ROS,   Endo/Other  negative endocrine ROS  Renal/GU negative Renal ROS  negative genitourinary   Musculoskeletal negative musculoskeletal ROS (+)   Abdominal Normal abdominal exam  (+)   Peds  Hematology negative hematology ROS (+)   Anesthesia Other Findings   Reproductive/Obstetrics                          Anesthesia Physical Anesthesia Plan  ASA: II  Anesthesia Plan: General   Post-op Pain Management:    Induction: Intravenous  Airway Management Planned: Oral ETT  Additional Equipment:   Intra-op Plan:   Post-operative Plan: Extubation in OR  Informed Consent: I have reviewed the patients History and Physical, chart, labs and discussed the procedure including the risks, benefits and alternatives for the proposed anesthesia with the patient or authorized representative who has indicated his/her understanding and acceptance.     Plan Discussed with: Surgeon and CRNA  Anesthesia Plan Comments:        Anesthesia Quick Evaluation

## 2013-05-15 NOTE — Transfer of Care (Signed)
Immediate Anesthesia Transfer of Care Note  Patient: [name]  Procedure(s) Performed: Procedure(s): ATTEMPTED ROBOTIC TOTAL HYSTERECTOMY (N/A) HYSTERECTOMY ABDOMINAL WITH BILATERAL SALPINGECTOMY (Bilateral)  Patient Location: PACU  Anesthesia Type:General  Level of Consciousness: awake, alert  and oriented  Airway & Oxygen Therapy: Patient Spontanous Breathing and Patient connected to nasal cannula oxygen  Post-op Assessment: Report given to PACU RN and Post -op Vital signs reviewed and stable  Post vital signs: Reviewed and stable  Complications: No apparent anesthesia complications

## 2013-05-16 ENCOUNTER — Encounter (HOSPITAL_COMMUNITY): Payer: Self-pay | Admitting: *Deleted

## 2013-05-16 DIAGNOSIS — D649 Anemia, unspecified: Secondary | ICD-10-CM

## 2013-05-16 DIAGNOSIS — N920 Excessive and frequent menstruation with regular cycle: Secondary | ICD-10-CM

## 2013-05-16 LAB — CBC
MCV: 70.5 fL — ABNORMAL LOW (ref 78.0–100.0)
Platelets: 198 10*3/uL (ref 150–400)
RBC: 4.04 MIL/uL (ref 3.87–5.11)
WBC: 10.4 10*3/uL (ref 4.0–10.5)

## 2013-05-16 LAB — BASIC METABOLIC PANEL
CO2: 26 mEq/L (ref 19–32)
Calcium: 7.8 mg/dL — ABNORMAL LOW (ref 8.4–10.5)
GFR calc non Af Amer: >90 mL/min (ref >90)
Sodium: 137 mEq/L (ref 135–145)

## 2013-05-16 LAB — TYPE AND SCREEN
ABO/RH(D): O POS
Unit division: 0

## 2013-05-16 MED ORDER — IBUPROFEN 600 MG PO TABS: 600.0000 mg | ORAL_TABLET | Freq: Four times a day (QID) | ORAL | Status: DC

## 2013-05-16 MED ORDER — POTASSIUM CHLORIDE 20 MEQ/15ML (10%) PO LIQD
40.0000 meq | Freq: Once | ORAL | Status: AC
  Administered 2013-05-16: 40 meq via ORAL
  Filled 2013-05-16: qty 30

## 2013-05-16 MED ORDER — PANTOPRAZOLE SODIUM 40 MG PO TBEC
40.0000 mg | DELAYED_RELEASE_TABLET | Freq: Every day | ORAL | Status: DC
  Administered 2013-05-16: 40 mg via ORAL
  Filled 2013-05-16: qty 1

## 2013-05-16 MED ORDER — OXYCODONE-ACETAMINOPHEN 5-325 MG PO TABS: 1.0000 | ORAL_TABLET | ORAL | Status: DC | PRN

## 2013-05-16 MED ORDER — IBUPROFEN 600 MG PO TABS
600.0000 mg | ORAL_TABLET | Freq: Four times a day (QID) | ORAL | Status: DC
  Administered 2013-05-16 – 2013-05-17 (×3): 600 mg via ORAL
  Filled 2013-05-16 (×3): qty 1

## 2013-05-16 MED ORDER — DSS 100 MG PO CAPS: 100.0000 mg | ORAL_CAPSULE | Freq: Two times a day (BID) | ORAL | Status: DC

## 2013-05-16 NOTE — Progress Notes (Signed)
1 Day Post-Op Procedure(s) (LRB): ATTEMPTED ROBOTIC TOTAL HYSTERECTOMY (N/A) HYSTERECTOMY ABDOMINAL WITH BILATERAL SALPINGECTOMY (Bilateral)  Subjective: Patient reports tolerating PO and no problems voiding.  Her foley was removed this am and she voided without difficulty.  She tol clears with no n/v.  No dizziness or SOB.  Pt was able to ambulate in halls last night. No flatus, no BM.  Objective: I have reviewed patient's vital signs, intake and output, medications and labs.  General: alert and no distress Resp: clear to auscultation bilaterally Cardio: regular rate and rhythm, S1, S2 normal, no murmur, click, rub or gallop GI: soft, non-tender; bowel sounds normal; no masses,  no organomegaly and incision: clean and dry Extremities: extremities normal, atraumatic, no cyanosis or edema Vaginal Bleeding: minimal NOTE: PT HAS STAPLE UNDER DRESSING   CBC    Component Value Date/Time   WBC 10.4 05/16/2013 0530   RBC 4.04 05/16/2013 0530   HGB 8.9* 05/16/2013 0530   HCT 28.5* 05/16/2013 0530   PLT 198 05/16/2013 0530   MCV 70.5* 05/16/2013 0530   MCH 22.0* 05/16/2013 0530   MCHC 31.2 05/16/2013 0530   RDW 20.1* 05/16/2013 0530   LYMPHSABS 1.8 12/05/2012 0247   MONOABS 0.3 12/05/2012 0247   EOSABS 0.0 12/05/2012 0247   BASOSABS 0.0 12/05/2012 0247   K+ 3.2     Assessment: s/p Procedure(s): ATTEMPTED ROBOTIC TOTAL HYSTERECTOMY (N/A) HYSTERECTOMY ABDOMINAL WITH BILATERAL SALPINGECTOMY (Bilateral): stable, progressing well and tolerating diet  Plan: Advance diet Encourage ambulation Discontinue IV fluids Replace K+  LOS: 1 day    HARRAWAY-SMITH, Skylynn Burkley 05/16/2013, 8:02 AM

## 2013-05-16 NOTE — Progress Notes (Signed)
Morphine PCA discontinued. 10.31mL cleared. Patient reports pain score 4/10. Will begin PO pain meds. Briante Loveall, [place], RN, BSN 8:44 AM

## 2013-05-17 NOTE — Progress Notes (Signed)
2 Days Post-Op Procedure(s) (LRB): ATTEMPTED ROBOTIC TOTAL HYSTERECTOMY (N/A) HYSTERECTOMY ABDOMINAL WITH BILATERAL SALPINGECTOMY (Bilateral)  Subjective: Patient reports tolerating PO and no problems voiding.  She is ambulating and voiced her readiness to go home today.  Objective: I have reviewed patient's vital signs, intake and output, medications and labs.  General: alert Resp: clear to auscultation bilaterally Cardio: regular rate and rhythm, S1, S2 normal, no murmur, click, rub or gallop GI: soft, non-tender; bowel sounds normal; no masses,  no organomegaly Incision: C/D/I, staples intact  Assessment: s/p Procedure(s): ATTEMPTED ROBOTIC TOTAL HYSTERECTOMY (N/A) HYSTERECTOMY ABDOMINAL WITH BILATERAL SALPINGECTOMY (Bilateral): stable  Plan: Discharge home  LOS: 2 days    Quanah Majka C. 05/17/2013, 7:14 AM

## 2013-05-17 NOTE — Discharge Summary (Signed)
Physician Discharge Summary  Patient ID: [name] MRN: [id number] DOB/AGE: 11-22-1965 47 y.o.  Admit date: 05/15/2013 Discharge date: 05/17/2013  Admission Diagnoses: fibroids and anemia  Discharge Diagnoses: same Active Problems:   * No active hospital problems. *   Discharged Condition: good  Hospital Course She underwent an uncomplicated TAH/BSO. By POD #1 she was ambulating, voiding, and tolerating po without n/v. By POD #2 she voiced her readiness to go home. Her K+ was noted to be 3.2 on POD#1 and it was replaced orally.  Consults: None  Significant Diagnostic Studies: labs: her po hbg was stable  Treatments: surgery: as above  Discharge Exam: Blood pressure 105/56, pulse 79, temperature 97.4 F (36.3 C), temperature source Oral, resp. rate 18, height 5\' 3"  (1.6 m), weight 84.823 kg (187 lb), SpO2 97.00%. General appearance: alert Resp: clear to auscultation bilaterally Cardio: regular rate and rhythm, S1, S2 normal, no murmur, click, rub or gallop GI: soft, non-tender; bowel sounds normal; no masses,  no organomegaly  Disposition: 01-Home or Self Care  Discharge Orders   Future Appointments Provider Department Dept Phone   06/18/2013 2:45 PM Willodean Rosenthal, MD [facility] [phone]   Future Orders Complete By Expires   Call MD for:  difficulty breathing, headache or visual disturbances  As directed    Call MD for:  extreme fatigue  As directed    Call MD for:  persistant dizziness or light-headedness  As directed    Call MD for:  persistant nausea and vomiting  As directed    Call MD for:  redness, tenderness, or signs of infection (pain, swelling, redness, odor or green/yellow discharge around incision site)  As directed    Call MD for:  severe uncontrolled pain  As directed    Call MD for:  temperature >100.4  As directed    Diet - low sodium heart healthy  As directed    Discharge wound care:  As directed    Comments:     Patient  has staples.  Needs f/u in 1 week for staple removal.   Driving Restrictions  As directed    Comments:     No driving while on pain medications   Increase activity slowly  As directed    Lifting restrictions  As directed    Comments:     No heavy lifting   Remove staples  As directed    Sexual Activity Restrictions  As directed    Comments:     Nothing in vagina including sexual intercourse for 6 weeks       Medication List    STOP taking these medications       megestrol 40 MG tablet  Commonly known as:  MEGACE     metroNIDAZOLE 500 MG tablet  Commonly known as:  FLAGYL     norgestimate-ethinyl estradiol 0.25-35 MG-MCG tablet  Commonly known as:  ORTHO-CYCLEN,SPRINTEC,PREVIFEM      TAKE these medications       albuterol 108 (90 BASE) MCG/ACT inhaler  Commonly known as:  PROVENTIL HFA;VENTOLIN HFA  Inhale 1-2 puffs into the lungs every 6 (six) hours as needed for wheezing.     DSS 100 MG Caps  Take 100 mg by mouth 2 (two) times daily.     ibuprofen 600 MG tablet  Commonly known as:  ADVIL,MOTRIN  Take 1 tablet (600 mg total) by mouth 4 (four) times daily.     oxyCODONE-acetaminophen 5-325 MG per tablet  Commonly known as:  PERCOCET/ROXICET  Take 1-2 tablets by mouth every 4 (four) hours as needed.           Follow-up Information   Follow up with [facility], CAROLYN, MD. Schedule an appointment as soon as possible for a visit in 6 weeks.   Specialty:  Obstetrics and Gynecology   Contact information:   [address] [place] [address] [phone]       Signed: Allie Bossier 05/17/2013, 7:17 AM

## 2013-05-21 ENCOUNTER — Encounter: Payer: Self-pay | Admitting: *Deleted

## 2013-05-22 ENCOUNTER — Ambulatory Visit (INDEPENDENT_AMBULATORY_CARE_PROVIDER_SITE_OTHER): Payer: Medicaid Other

## 2013-05-22 VITALS — Temp 99.0°F

## 2013-05-22 DIAGNOSIS — Z09 Encounter for follow-up examination after completed treatment for conditions other than malignant neoplasm: Secondary | ICD-10-CM

## 2013-05-22 DIAGNOSIS — Z4802 Encounter for removal of sutures: Secondary | ICD-10-CM

## 2013-05-22 NOTE — Progress Notes (Signed)
Pt here for a staple removal.  Incision well approximated with no odor and no drainage.  No edema or erythema.  Removed staples.  Applied ster-strips and covered with abd pad.  Advised pt on reasons to go to [facility] such as fever or discharge.  To make sure she continues guarding and to allow the steri-strips to follow off on there own.  Pt stated understanding.  I verified pts post op appt on Oct 1st.  Pt stated understanding.

## 2013-06-18 ENCOUNTER — Ambulatory Visit: Payer: Medicaid Other | Admitting: Obstetrics & Gynecology

## 2013-06-24 ENCOUNTER — Encounter: Payer: Self-pay | Admitting: *Deleted

## 2013-08-06 ENCOUNTER — Ambulatory Visit (INDEPENDENT_AMBULATORY_CARE_PROVIDER_SITE_OTHER): Payer: Medicaid Other | Admitting: Obstetrics & Gynecology

## 2013-08-06 ENCOUNTER — Encounter: Payer: Self-pay | Admitting: Obstetrics & Gynecology

## 2013-08-06 VITALS — BP 139/79 | HR 70 | Temp 97.5°F | Ht 60.0 in | Wt 182.8 lb

## 2013-08-06 DIAGNOSIS — G8929 Other chronic pain: Secondary | ICD-10-CM

## 2013-08-06 DIAGNOSIS — N949 Unspecified condition associated with female genital organs and menstrual cycle: Secondary | ICD-10-CM

## 2013-08-06 NOTE — Progress Notes (Signed)
Subjective:     Patient ID: [name], female   DOB: [birth year], 47 y.o.   MRN: [id number]  HPI Pt presents for post op check.  She is without complaint.  She does report that she still occ has pain in her incision which is nothing close to the pain that she had preop.  She reports numbness in her incision.    Review of Systems     Objective:   Physical Exam BP 139/79  Pulse 70  Temp(Src) 97.5 F (36.4 C)  Ht 5' (1.524 m)  Wt 182 lb 12.8 oz (82.918 kg)  BMI 35.70 kg/m2  LMP 07/23/2012  Abd:  Soft, NT, ND.  Well healed vertical and transverse incision.      Assessment:     3 month postop check. Pt doing well     Plan:     F/u 1 year or sooner prn

## 2013-08-06 NOTE — Patient Instructions (Signed)
Hysterectomy Information A hysterectomy is a surgery to remove your uterus. After surgery, you will not have periods (menses) or be able to get pregnant.  REASONS FOR THIS SURGERY  You have bleeding that is not normal and keeps coming back.  You have lasting (chronic) lower belly (pelvic) pain.  You have a lasting infection.  The lining of your uterus grows outside your uterus.  Your uterus falls down into your vagina.  You have a growth in your uterus that causes problems.  You have cells that could turn into cancer (precancerous cells).  You have cancer of the uterus or cervix. TYPES  There are 3 types of hysterectomies. Depending on the type, the surgery will:  Remove the top part of the uterus only.  Remove the uterus and the cervix.  Remove the uterus, cervix, and tissue that holds the uterus in place in the lower belly. WAYS A HYSTERECTOMY CAN BE PERFORMED There are 5 ways this surgery can be performed.   A cut (incision) is made in the belly (abdomen). The uterus is taken out through the cut.  A cut is made in the vagina. The uterus is taken out through the cut.  Three to four cuts are made in the belly. A surgical device is put through the cuts. The uterus is cut into small pieces by the surgical device. The uterus is taken out through the cuts or the vagina.  Three or four cuts are made in the belly. A surgical device is put through the cuts. The uterus is taken out through the vagina.  Three or four cuts are made in the belly. A surgical device that is controlled by a computer makes a visual image. The image helps the surgeon control the surgical device. The uterus is cut into small pieces. The pieces are taken out though the cuts or through the vagina. WHAT TO EXPECT AFTER THE SURGERY  You will be given pain medicine.  You will need help at home for 3 to 5 days after surgery.  You will need to see your doctor in 2 to 4 weeks after surgery.  You may get hot  flashes, night sweats, and have trouble sleeping.  You may need to have Pap tests in the future if your surgery was related to cancer. Talk to your doctor. It is still good to have regular exams. Document Released: 11/27/2011 Document Reviewed: 11/27/2011 [facility] Patient Information 2014 [place], [place].

## 2013-11-20 ENCOUNTER — Emergency Department (HOSPITAL_COMMUNITY): Payer: Medicaid Other

## 2013-11-20 ENCOUNTER — Encounter (HOSPITAL_COMMUNITY): Payer: Self-pay | Admitting: Emergency Medicine

## 2013-11-20 ENCOUNTER — Emergency Department (HOSPITAL_COMMUNITY)
Admission: EM | Admit: 2013-11-20 | Discharge: 2013-11-20 | Disposition: A | Discharge status: Home / Self Care | Payer: Medicaid Other | Attending: Emergency Medicine | Admitting: Emergency Medicine

## 2013-11-20 DIAGNOSIS — R21 Rash and other nonspecific skin eruption: Secondary | ICD-10-CM

## 2013-11-20 DIAGNOSIS — Z8679 Personal history of other diseases of the circulatory system: Secondary | ICD-10-CM | POA: Insufficient documentation

## 2013-11-20 DIAGNOSIS — M5412 Radiculopathy, cervical region: Secondary | ICD-10-CM | POA: Insufficient documentation

## 2013-11-20 DIAGNOSIS — J45909 Unspecified asthma, uncomplicated: Secondary | ICD-10-CM | POA: Insufficient documentation

## 2013-11-20 DIAGNOSIS — M19011 Primary osteoarthritis, right shoulder: Secondary | ICD-10-CM

## 2013-11-20 DIAGNOSIS — M19019 Primary osteoarthritis, unspecified shoulder: Secondary | ICD-10-CM | POA: Insufficient documentation

## 2013-11-20 DIAGNOSIS — Z79899 Other long term (current) drug therapy: Secondary | ICD-10-CM | POA: Insufficient documentation

## 2013-11-20 MED ORDER — HYDROCORTISONE 1 % EX CREA: TOPICAL_CREAM | CUTANEOUS | Status: DC

## 2013-11-20 MED ORDER — MELOXICAM 7.5 MG PO TABS: 7.5000 mg | ORAL_TABLET | Freq: Every day | ORAL | Status: DC

## 2013-11-20 MED ORDER — NAPROXEN 250 MG PO TABS
375.0000 mg | ORAL_TABLET | Freq: Once | ORAL | Status: AC
  Administered 2013-11-20: 375 mg via ORAL
  Filled 2013-11-20: qty 2

## 2013-11-20 MED ORDER — METHOCARBAMOL 500 MG PO TABS: 500.0000 mg | ORAL_TABLET | Freq: Two times a day (BID) | ORAL | Status: DC

## 2013-11-20 MED ORDER — METHOCARBAMOL 500 MG PO TABS
500.0000 mg | ORAL_TABLET | Freq: Once | ORAL | Status: AC
  Administered 2013-11-20: 500 mg via ORAL
  Filled 2013-11-20: qty 1

## 2013-11-20 NOTE — Discharge Instructions (Signed)
Please call and set up an appointment with orthopedics regarding neck pain and shoulder pain Please call and set up appointment with dermatologist regarding rashes been intermittent for the past 2 years Please take medications as prescribed and on a full stomach Please apply hydrocortisone cream to area of rash Please rest and stay hydrated Please avoid any physical or strenuous activity Please continue to monitor symptoms closely and if symptoms are to worsen or change (fever greater than 101, chills, chest pain, shortness of breath, difficulty breathing, personality changes, numbness, tingling, weakness, facial drooping, slurred speech, memory changes, fall, injury) is report back to emergency department immediately  Arthritis, Nonspecific Arthritis is inflammation of a joint. This usually means pain, redness, warmth or swelling are present. One or more joints may be involved. There are a number of types of arthritis. Your caregiver may not be able to tell what type of arthritis you have right away. CAUSES  The most common cause of arthritis is the wear and tear on the joint (osteoarthritis). This causes damage to the cartilage, which can break down over time. The knees, hips, back and neck are most often affected by this type of arthritis. Other types of arthritis and common causes of joint pain include:  Sprains and other injuries near the joint. Sometimes minor sprains and injuries cause pain and swelling that develop hours later.  Rheumatoid arthritis. This affects hands, feet and knees. It usually affects both sides of your body at the same time. It is often associated with chronic ailments, fever, weight loss and general weakness.  Crystal arthritis. Gout and pseudo gout can cause occasional acute severe pain, redness and swelling in the foot, ankle, or knee.  Infectious arthritis. Bacteria can get into a joint through a break in overlying skin. This can cause infection of the joint.  Bacteria and viruses can also spread through the blood and affect your joints.  Drug, infectious and allergy reactions. Sometimes joints can become mildly painful and slightly swollen with these types of illnesses. SYMPTOMS   Pain is the main symptom.  Your joint or joints can also be red, swollen and warm or hot to the touch.  You may have a fever with certain types of arthritis, or even feel overall ill.  The joint with arthritis will hurt with movement. Stiffness is present with some types of arthritis. DIAGNOSIS  Your caregiver will suspect arthritis based on your description of your symptoms and on your exam. Testing may be needed to find the type of arthritis:  Blood and sometimes urine tests.  X-ray tests and sometimes CT or MRI scans.  Removal of fluid from the joint (arthrocentesis) is done to check for bacteria, crystals or other causes. Your caregiver (or a specialist) will numb the area over the joint with a local anesthetic, and use a needle to remove joint fluid for examination. This procedure is only minimally uncomfortable.  Even with these tests, your caregiver may not be able to tell what kind of arthritis you have. Consultation with a specialist (rheumatologist) may be helpful. TREATMENT  Your caregiver will discuss with you treatment specific to your type of arthritis. If the specific type cannot be determined, then the following general recommendations may apply. Treatment of severe joint pain includes:  Rest.  Elevation.  Anti-inflammatory medication (for example, ibuprofen) may be prescribed. Avoiding activities that cause increased pain.  Only take over-the-counter or prescription medicines for pain and discomfort as recommended by your caregiver.  Cold packs over an inflamed  joint may be used for 10 to 15 minutes every hour. Hot packs sometimes feel better, but do not use overnight. Do not use hot packs if you are diabetic without your caregiver's  permission.  A cortisone shot into arthritic joints may help reduce pain and swelling.  Any acute arthritis that gets worse over the next 1 to 2 days needs to be looked at to be sure there is no joint infection. Long-term arthritis treatment involves modifying activities and lifestyle to reduce joint stress jarring. This can include weight loss. Also, exercise is needed to nourish the joint cartilage and remove waste. This helps keep the muscles around the joint strong. HOME CARE INSTRUCTIONS   Do not take aspirin to relieve pain if gout is suspected. This elevates uric acid levels.  Only take over-the-counter or prescription medicines for pain, discomfort or fever as directed by your caregiver.  Rest the joint as much as possible.  If your joint is swollen, keep it elevated.  Use crutches if the painful joint is in your leg.  Drinking plenty of fluids may help for certain types of arthritis.  Follow your caregiver's dietary instructions.  Try low-impact exercise such as:  Swimming.  Water aerobics.  Biking.  Walking.  Morning stiffness is often relieved by a warm shower.  Put your joints through regular range-of-motion. SEEK MEDICAL CARE IF:   You do not feel better in 24 hours or are getting worse.  You have side effects to medications, or are not getting better with treatment. SEEK IMMEDIATE MEDICAL CARE IF:   You have a fever.  You develop severe joint pain, swelling or redness.  Many joints are involved and become painful and swollen.  There is severe back pain and/or leg weakness.  You have loss of bowel or bladder control. Document Released: 10/12/2004 Document Revised: 11/27/2011 Document Reviewed: 10/28/2008 [facility] Patient Information 2014 [place], [place].  Contact Dermatitis Contact dermatitis is a reaction to certain substances that touch the skin. Contact dermatitis can be either irritant contact dermatitis or allergic contact dermatitis. Irritant  contact dermatitis does not require previous exposure to the substance for a reaction to occur.Allergic contact dermatitis only occurs if you have been exposed to the substance before. Upon a repeat exposure, your body reacts to the substance.  CAUSES  Many substances can cause contact dermatitis. Irritant dermatitis is most commonly caused by repeated exposure to mildly irritating substances, such as:  Makeup.  Soaps.  Detergents.  Bleaches.  Acids.  Metal salts, such as nickel. Allergic contact dermatitis is most commonly caused by exposure to:  Poisonous plants.  Chemicals (deodorants, shampoos).  Jewelry.  Latex.  Neomycin in triple antibiotic cream.  Preservatives in products, including clothing. SYMPTOMS  The area of skin that is exposed may develop:  Dryness or flaking.  Redness.  Cracks.  Itching.  Pain or a burning sensation.  Blisters. With allergic contact dermatitis, there may also be swelling in areas such as the eyelids, mouth, or genitals.  DIAGNOSIS  Your caregiver can usually tell what the problem is by doing a physical exam. In cases where the cause is uncertain and an allergic contact dermatitis is suspected, a patch skin test may be performed to help determine the cause of your dermatitis. TREATMENT Treatment includes protecting the skin from further contact with the irritating substance by avoiding that substance if possible. Barrier creams, powders, and gloves may be helpful. Your caregiver may also recommend:  Steroid creams or ointments applied 2 times daily. For  best results, soak the rash area in cool water for 20 minutes. Then apply the medicine. Cover the area with a plastic wrap. You can store the steroid cream in the refrigerator for a "chilly" effect on your rash. That may decrease itching. Oral steroid medicines may be needed in more severe cases.  Antibiotics or antibacterial ointments if a skin infection is present.  Antihistamine  lotion or an antihistamine taken by mouth to ease itching.  Lubricants to keep moisture in your skin.  Burow's solution to reduce redness and soreness or to dry a weeping rash. Mix one packet or tablet of solution in 2 cups cool water. Dip a clean washcloth in the mixture, wring it out a bit, and put it on the affected area. Leave the cloth in place for 30 minutes. Do this as often as possible throughout the day.  Taking several cornstarch or baking soda baths daily if the area is too large to cover with a washcloth. Harsh chemicals, such as alkalis or acids, can cause skin damage that is like a burn. You should flush your skin for 15 to 20 minutes with cold water after such an exposure. You should also seek immediate medical care after exposure. Bandages (dressings), antibiotics, and pain medicine may be needed for severely irritated skin.  HOME CARE INSTRUCTIONS  Avoid the substance that caused your reaction.  Keep the area of skin that is affected away from hot water, soap, sunlight, chemicals, acidic substances, or anything else that would irritate your skin.  Do not scratch the rash. Scratching may cause the rash to become infected.  You may take cool baths to help stop the itching.  Only take over-the-counter or prescription medicines as directed by your caregiver.  See your caregiver for follow-up care as directed to make sure your skin is healing properly. SEEK MEDICAL CARE IF:   Your condition is not better after 3 days of treatment.  You seem to be getting worse.  You see signs of infection such as swelling, tenderness, redness, soreness, or warmth in the affected area.  You have any problems related to your medicines. Document Released: 09/01/2000 Document Revised: 11/27/2011 Document Reviewed: 02/07/2011 [facility] Patient Information 2014 [place], [place].   Emergency Department Resource Guide 1) Find a Doctor and Pay Out of Pocket Although you won't have to find out who  is covered by your insurance plan, it is a good idea to ask around and get recommendations. You will then need to call the office and see if the doctor you have chosen will accept you as a new patient and what types of options they offer for patients who are self-pay. Some doctors offer discounts or will set up payment plans for their patients who do not have insurance, but you will need to ask so you aren't surprised when you get to your appointment.  2) Contact Your Local Health Department Not all health departments have doctors that can see patients for sick visits, but many do, so it is worth a call to see if yours does. If you don't know where your local health department is, you can check in your phone book. The CDC also has a tool to help you locate your state's health department, and many state websites also have listings of all of their local health departments.  3) Find a Walk-in Clinic If your illness is not likely to be very severe or complicated, you may want to try a walk in clinic. These are popping up all  over the country in pharmacies, drugstores, and shopping centers. They're usually staffed by nurse practitioners or physician assistants that have been trained to treat common illnesses and complaints. They're usually fairly quick and inexpensive. However, if you have serious medical issues or chronic medical problems, these are probably not your best option.  No Primary Care Doctor: - Call Health Connect at  [phone] - they can help you locate a primary care doctor that  accepts your insurance, provides certain services, etc. - Physician Referral Service- [phone]  Chronic Pain Problems: Organization         Address  Phone   Notes  [facility]  [phone] Patients need to be referred by their primary care doctor.   Medication Assistance: Organization         Address  Phone   Notes  [facility] Medication [facility] [address] [place]  [address] [place], [place] [address] [phone] --Must be a resident of [facility] -- Must have NO insurance coverage whatsoever (no Medicaid/ Medicare, etc.) -- The pt. MUST have a primary care doctor that directs their care regularly and follows them in the community   Med[facility]ist  [phone]   [employer]  [phone]    Agencies that provide inexpensive medical care: Organization         Address  Phone   Notes  [facility]  [phone]   [facility]    [phone]   [facility] [address] [place], [place] [address] [phone]   [facility] [address], [place] [phone]   Planned Parenthood    [phone]   [facility]    [phone]   [facility] [place]  [address], [place] Phone:  [phone], Fax:  [phone] Hours of Operation:  9 am - 6 pm, M-F.  Also accepts Medicaid/Medicare and self-pay.  [facility] for [facility]  [address], [place] Phone: [phone], Fax: [phone]. Hours of Operation:  8:30 am - 5:30 pm, M-F.  Also accepts Medicaid and self-pay.  [facility] [place] [address], [place] Phone: [phone]   [facility] [address] Natasha Bence [place], [place] [phone], Ext. [phone] Mondays & Thursdays: 7-9 AM.  First 15 patients are seen on a first come, first serve basis.    Medicaid-accepting [facility] Providers:  Organization         Address  Phone   Notes  [facility] [address], [place] [phone] Also accepts self-pay patients.  [facility] [address] Laurell Josephs [place], [place]  [phone]   [facility] [address], [place] [phone]   [facility] [address], [place] [phone]   Renaye Rakers [address], [place]   [phone] Only accepts [place] Access [place] patients after they have their name applied to their card.   Self-Pay (no insurance) in [facility]:  Organization         Address  Phone   Notes  Sickle Cell Patients, [facility] Internal Medicine [address] [place], [place] [phone]   [facility] Care [address] [place], [place] [phone]   [facility] [place]  [address], [place] [phone]   [facility]/Dr. Osei-Bonsu  [address] [place] [address], [place] or [address], [place] [phone] Phone number for both [place] and [place] locations is the same.  [facility]gent Medical and [facility] [address], [place] [phone]   [facility] [address] [place] [address], [place] or [address] [phone]   [facility] [address], [place] [phone], phone; [phone], fax Sees patients 1st and 3rd Saturday of every month.  Must not qualify for public or private insurance (i.e. Medicaid, Medicare, [place] Health Choice, Veterans' Benefits)  Household income should be no more than 200% of the poverty level The clinic cannot treat you if you are pregnant or think you are pregnant  Sexually transmitted diseases are not treated at the clinic.    Dental Care: Organization         Address  Phone  Notes  [facility] [address], [place] [phone] Accepts children up to age 50 who are enrolled in [place]icaid or [place] Health Choice; pregnant women with a Medicaid card; and children who have applied for Medicaid or [place] Health Choice, but were declined, whose parents can pay a reduced fee at time of service.  [facility] [place]  [address], [place] [phone] Accepts children up to age 50 who are  enrolled in [place]icaid or [place] Health Choice; pregnant women with a Medicaid card; and children who have applied for Medicaid or [place] Health Choice, but were declined, whose parents can pay a reduced fee at time of service.  [facility]  [address], [place] [phone] Patients are seen by appointment only. Walk-ins are not accepted. [facility] will see patients 50 years of age and older. Monday - Tuesday (8am-5pm) Most Wednesdays (8:30-5pm) $30 per visit, cash only  [facility]  [address], [facility] [phone] Patients are seen by appointment only. Walk-ins are not accepted. [facility] will see patients 50 years of age and older. One Wednesday Evening (Monthly: Volunteer Based).  $30 per visit, cash only  [employer] [facility]  [phone] for adults; Children under age 324, call Graduate Pediatric Dentistry at [phone]. Children aged 354-14, please call [phone] to request a pediatric application.  Dental services are provided in all areas of dental care including fillings, crowns and bridges, complete and partial dentures, implants, gum treatment, root canals, and extractions. Preventive care is also provided. Treatment is provided to both adults and children. Patients are selected via a lottery and there is often a waiting list.   [facility] [address], [place]  [phone] [email or web address]   [facility] [address], [place], [place] [phone], Ext. [phone] Second and Fourth Thursday of each month, opens at 6:30 AM; Clinic ends at 9 AM.  Patients are seen on a first-come first-served basis, and a limited number are seen during each clinic.   [facility]  [address], [place], [place] [phone]   Eligibility Requirements You must have lived in [place], [place], or [place] counties for at least the last three months.   You  cannot be eligible for state or federal sponsored National Cityhealthcare insurance, including CIGNAVeterans Administration, [place]icaid, or [employer]Medicare.  You generally cannot be eligible for healthcare insurance through your employer.    How to apply: Eligibility screenings are held every Tuesday and Wednesday afternoon from 1:00 pm until 4:00 pm. You do not need an appointment for the interview!  [facility] [address], [place], [place]   [place]  [place] Department  [place]  [phone]    Behavioral Health Resources in the Community: Intensive Outpatient Programs Organization         Address  Phone  Notes  [place]. [address] [place], [place] [phone]   [facility] Outpatient [address], [place], [place]   ADS: Alcohol & Drug Svcs [address] [place], [place]   Door [address],  [place], [place] or [phone]   Substance Abuse Resources Organization         Address  Phone  Notes  Alcohol and Drug Services  [phone]   [place]  [phone]   The [place]   Chinita Pester  [phone]   Residential & Outpatient Substance Abuse Program  [phone]   Psychological Services Organization         Address  Phone  Notes  [facility] [place]  [place]  [phone]   [place] [address] [place] or [phone]    Mobile Crisis Teams Organization         Address  Phone  Notes  [facility], Mobile Crisis Care Unit  [phone]   [facility]ive Psychotherapeutic Services  [address] [place], [place]   [place] Levels [address] [place] [phone]    Self-Help/Support  Groups Organization         Address  Phone             Notes  [place]. of [place] - variety of support groups  [place]ams Call for more information  Narcotics Anonymous (NA), [facility]aring [facility] [address], [employer] [place]  2 meetings at this location   Special educational needs teacher         Address  Phone  Notes  ASAP Residential Treatment [place],    [place]  [phone]   [facility]  [address] [place] [phone], [place], [place]   [place], [place] [phone] Admissions: 8am-3pm M-F  Incentives Substance [place] [address]    [place], [place] [phone]   [facility] [address] [place], [place], [place]   [facility] [address],  [place], [place]   [facility] - Intensive Outpatient [address] Kristeen Mans [address], [place], [place]   [facility] ([place].,  [place], [place] [phone] or [phone]   [facility] (RTS) [address] [place], [place] Accepts Medicaid  [facility] [place] [address],  [place] [phone] Substance Abuse/Addiction Treatment   [facility] Organization         Address  Phone  Notes  [facility]  [phone]   Domenic Schwab, PhD [address] [place], [place]   [phone] or [phone]   [facility]   [phone]  [address] [place], [place] [phone]   [facility] [address], [place], [place] [phone] Insurance/Medicaid/sponsorship through [facility] and Families [address] Ste [place], [place] [phone] [place], [place] [phone]    Dr. Adele Schilder  [phone]   [facility] Dept. 1) [address], [place] 2) [place] 3)  [place] [address], [place] [phone]  [phone]   [place] [phone] or [phone] (After Hours)

## 2013-11-20 NOTE — ED Notes (Signed)
Pt presents with 1 year h/o R shoulder and neck pain.  Pt denies any recent injury, reports pain has worsened.  Pt also reports L lower back/buttock pain, reports rash to lower back x 1 year.

## 2013-11-20 NOTE — ED Provider Notes (Signed)
CSN: [id number]     Arrival date & time 11/20/13  1128 History  This chart was scribed for non-physician practitioner, Jamse Mead, PA-C working with Sharyon Cable, MD by Frederich Balding, ED scribe. This patient was seen in room TR10C/TR10C and the patient's care was started at 12:32 PM.    Chief Complaint  Patient presents with  . Rash  . Back Pain   The history is provided by the patient. No language interpreter was used.   HPI Comments: [name] is a 48 y.o. female who presents to the Emergency Department complaining of chronic right shoulder pain that started one year ago. She states it worsened recently and is now having trouble lifting her arm due to pain. Denies recent injury or fall. Pt has been seen for the same in the past and given a medication with some relief. She states she can not remember what the medication was. She has also had right sided neck pain that stated one week ago. Pt states she also has burning lower left back pain and an intermittent, itchy rash to her left buttock that started about two years ago. Denies new soaps, detergents or medications. Denies fever, chills, sore throat, blurred vision, chest pain, SOB, difficulty breathing, numbness, weakness. Pt does not smoke cigarettes.   Past Medical History  Diagnosis Date  . Asthma   . Headache(784.0)   . Arthritis     rt shoulder  . Shortness of breath     on exertion  . Dysrhythmia     sometimes on exertion   Past Surgical History  Procedure Laterality Date  . Cesarean section  1995  . Robotic assisted total hysterectomy N/A 05/15/2013    Procedure: ATTEMPTED ROBOTIC TOTAL HYSTERECTOMY;  Surgeon: Lavonia Drafts, MD;  Location: [facility];  Service: Gynecology;  Laterality: N/A;  . Abdominal hysterectomy Bilateral 05/15/2013    Procedure: HYSTERECTOMY ABDOMINAL WITH BILATERAL SALPINGECTOMY;  Surgeon: Lavonia Drafts, MD;  Location: [facility];  Service: Gynecology;  Laterality: Bilateral;    History reviewed. No pertinent family history. History  Substance Use Topics  . Smoking status: Never Smoker   . Smokeless tobacco: Never Used  . Alcohol Use: No   OB History   Grav Para Term Preterm Abortions TAB SAB Ect Mult Living   11 5 1 4 6  6   5      Review of Systems  Constitutional: Negative for fever and chills.  HENT: Negative for sore throat.   Eyes: Negative for visual disturbance.  Respiratory: Negative for shortness of breath.   Cardiovascular: Negative for chest pain.  Musculoskeletal: Positive for arthralgias, back pain and neck pain.  Skin: Positive for rash.  Neurological: Negative for weakness and numbness.  All other systems reviewed and are negative.   Allergies  Review of patient's allergies indicates no known allergies.  Home Medications   Current Outpatient Rx  Name  Route  Sig  Dispense  Refill  . albuterol (PROVENTIL HFA;VENTOLIN HFA) 108 (90 BASE) MCG/ACT inhaler   Inhalation   Inhale 1-2 puffs into the lungs every 6 (six) hours as needed for wheezing.   1 Inhaler   0   . naproxen sodium (ANAPROX) 220 MG tablet   Oral   Take 220 mg by mouth daily as needed (for pain).         . hydrocortisone cream 1 %      Apply to affected area 2 times daily   15 g   0   .  meloxicam (MOBIC) 7.5 MG tablet   Oral   Take 1 tablet (7.5 mg total) by mouth daily.   15 tablet   0   . methocarbamol (ROBAXIN) 500 MG tablet   Oral   Take 1 tablet (500 mg total) by mouth 2 (two) times daily.   20 tablet   0    BP 131/71  Pulse 78  Temp(Src) 97.8 F (36.6 C) (Oral)  Resp 18  Ht 5\' 3"  (1.6 m)  Wt 187 lb (84.823 kg)  BMI 33.13 kg/m2  SpO2 97%  LMP 07/23/2012  Physical Exam  Nursing note and vitals reviewed. Constitutional: She is oriented to person, place, and time. She appears well-developed and well-nourished. No distress.  HENT:  Head: Normocephalic and atraumatic.  Mouth/Throat: Oropharynx is clear and moist. No oropharyngeal  exudate.  Eyes: Conjunctivae and EOM are normal. Pupils are equal, round, and reactive to light. Right eye exhibits no discharge. Left eye exhibits no discharge.  Negative nystagmus Visual fields grossly intact  Neck: Normal range of motion. Neck supple. No tracheal deviation present.  Discomfort upon palpation to the musculature of the neck on the right side Negative neck stiffness Negative nuchal rigidity Negative cervical lymphadenopathy  Cardiovascular: Normal rate, regular rhythm and normal heart sounds.   Pulses:      Radial pulses are 2+ on the right side, and 2+ on the left side.       Dorsalis pedis pulses are 2+ on the right side, and 2+ on the left side.  Pulmonary/Chest: Effort normal and breath sounds normal. No respiratory distress. She has no wheezes. She has no rhonchi. She has no rales. She exhibits no tenderness.  Negative use of accessory muscles Patient is able to speak in full sentences without difficulty Negative stridor  Musculoskeletal: Normal range of motion.       Arms: Negative swelling, erythema, inflammation, lesions, sores, deformities noted to the right shoulder. Discomfort upon palpation to the right trapezius muscle and right shoulder anterior and posterior aspect. Circumferential discomfort upon palpation to the right deltoid. Negative drop arm. Negative arm drift. Negative sunken in appearance. Mild decreased range of motion to the right shoulder secondary to pain-most discomfort upon flexion. Full range of motion to lower tremors bilaterally without difficulty noted or ataxia.  Lymphadenopathy:    She has no cervical adenopathy.  Neurological: She is alert and oriented to person, place, and time. No cranial nerve deficit. She exhibits normal muscle tone. Coordination normal.  Cranial nerves III-XII grossly intact Strength 5+/5+ to upper and lower extremities bilaterally with resistance applied, equal distribution noted Strength intact to MCP, PIP, DIP  joints of right hand Equal grip strength Negative facial drooping Negative slurred speech Negative arm drift Sensation intact with differentiation to sharp and dull touch  Skin: Skin is warm and dry. Rash noted.  Approximately 1 mm erythematous patch identified on the left buttock with negative surrounding erythema, cellulitis. Negative pain upon palpation. Negative active drainage noted.  Psychiatric: She has a normal mood and affect. Her behavior is normal.    ED Course  Procedures (including critical care time)  DIAGNOSTIC STUDIES: Oxygen Saturation is 97% on RA, normal by my interpretation.    COORDINATION OF CARE: 12:44 PM-Discussed treatment plan which includes xrays with pt at bedside and pt agreed to plan.    Dg Cervical Spine Complete  11/20/2013   CLINICAL DATA:  Neck pain, no injury  EXAM: CERVICAL SPINE  4+ VIEWS  COMPARISON:  None.  FINDINGS:  There is no evidence of cervical spine fracture or prevertebral soft tissue swelling. Alignment is normal. No other significant bone abnormalities are identified.  IMPRESSION: Negative cervical spine radiographs.   Electronically Signed   By: Franchot Gallo M.D.   On: 11/20/2013 13:35   Dg Shoulder Right  11/20/2013   CLINICAL DATA:  Shoulder pain  EXAM: RIGHT SHOULDER - 2+ VIEW  COMPARISON:  None.  FINDINGS: Negative for fracture.  Normal alignment.  Degenerative change in the shoulder joint and AC joint with joint space narrowing and osteophyte formation.  IMPRESSION: Degenerative changes.  Negative for fracture.   Electronically Signed   By: Franchot Gallo M.D.   On: 11/20/2013 13:36   Ct Head Wo Contrast  11/20/2013   CLINICAL DATA:  Headaches, blurred vision  EXAM: CT HEAD WITHOUT CONTRAST  TECHNIQUE: Contiguous axial images were obtained from the base of the skull through the vertex without intravenous contrast.  COMPARISON:  CT HEAD W/O CM dated 08/22/2010  FINDINGS: There is no evidence of mass effect, midline shift or extra-axial  fluid collections. There is no evidence of a space-occupying lesion or intracranial hemorrhage. There is no evidence of a cortical-based area of acute infarction. There is a 13.5 mm right frontal dural calcification unchanged in the prior exams.  The ventricles and sulci are appropriate for the patient's age. The basal cisterns are patent.  Visualized portions of the orbits are unremarkable. The visualized portions of the paranasal sinuses and mastoid air cells are unremarkable.  The osseous structures are unremarkable.  IMPRESSION: No acute intracranial pathology.   Electronically Signed   By: Kathreen Devoid   On: 11/20/2013 13:35   Labs Review Labs Reviewed - No data to display Imaging Review Dg Cervical Spine Complete  11/20/2013   CLINICAL DATA:  Neck pain, no injury  EXAM: CERVICAL SPINE  4+ VIEWS  COMPARISON:  None.  FINDINGS: There is no evidence of cervical spine fracture or prevertebral soft tissue swelling. Alignment is normal. No other significant bone abnormalities are identified.  IMPRESSION: Negative cervical spine radiographs.   Electronically Signed   By: Franchot Gallo M.D.   On: 11/20/2013 13:35   Dg Shoulder Right  11/20/2013   CLINICAL DATA:  Shoulder pain  EXAM: RIGHT SHOULDER - 2+ VIEW  COMPARISON:  None.  FINDINGS: Negative for fracture.  Normal alignment.  Degenerative change in the shoulder joint and AC joint with joint space narrowing and osteophyte formation.  IMPRESSION: Degenerative changes.  Negative for fracture.   Electronically Signed   By: Franchot Gallo M.D.   On: 11/20/2013 13:36   Ct Head Wo Contrast  11/20/2013   CLINICAL DATA:  Headaches, blurred vision  EXAM: CT HEAD WITHOUT CONTRAST  TECHNIQUE: Contiguous axial images were obtained from the base of the skull through the vertex without intravenous contrast.  COMPARISON:  CT HEAD W/O CM dated 08/22/2010  FINDINGS: There is no evidence of mass effect, midline shift or extra-axial fluid collections. There is no evidence of  a space-occupying lesion or intracranial hemorrhage. There is no evidence of a cortical-based area of acute infarction. There is a 13.5 mm right frontal dural calcification unchanged in the prior exams.  The ventricles and sulci are appropriate for the patient's age. The basal cisterns are patent.  Visualized portions of the orbits are unremarkable. The visualized portions of the paranasal sinuses and mastoid air cells are unremarkable.  The osseous structures are unremarkable.  IMPRESSION: No acute intracranial pathology.   Electronically Signed  By: Kathreen Devoid   On: 11/20/2013 13:35     EKG Interpretation None      MDM   Final diagnoses:  Cervical radiculopathy  Arthritis of right shoulder region  Rash   Medications  methocarbamol (ROBAXIN) tablet 500 mg (not administered)  naproxen (NAPROSYN) tablet 375 mg (not administered)    Filed Vitals:   11/20/13 1148 11/20/13 1411  BP: 131/71 153/80  Pulse: 78 63  Temp: 97.8 F (36.6 C) 98.6 F (37 C)  TempSrc: Oral Oral  Resp: 18 16  Height: 5\' 3"  (1.6 m)   Weight: 187 lb (84.823 kg)   SpO2: 97% 100%   I personally performed the services described in this documentation, which was scribed in my presence. The recorded information has been reviewed and is accurate.  Patient presenting to the ED with right shoulder pain and right sided neck pain that has been ongoing for approximately one year. Reported that the pain is a throbbing sensation with radiation from the neck down to her right arm. Patient reported that there is pain when she lifts her arm up. Stated that she's been having this rash intermittently for the past 2 years localized to her left buttocks described as a itching, burning sensation. Stated that these symptoms come and go throughout the year-reported that she's here today because the symptoms returned. Daughter presents with mother stating that patient has been seen and assessed in the ED regarding these findings and  that patient is normally given medications that controlled the discomfort. Alert and oriented. GCS 15. Heart rate and rhythm normal. Lungs clear to auscultation to upper and lower lobes bilaterally. Radial and DP pulses 2+ bilaterally. Negative swelling, erythema, inflammation, lesions, sores, deformities, sunken in appearance noted to the right shoulder. Negative deformities noted to the neck. Discomfort upon palpation to the musculature of the right side the neck, right trapezius, right shoulder anterior posterior aspect and right deltoid circumferentially. Full range of motion to right upper extremity with discomfort identified with flexion. Equal grip strength identified. Negative drop arm.  Negative facial drooping. Negative slurred speech. Sensation intact with differentiation sharp and dull touch. Approximately 1 mm erythematous patch identified on the left buttock with negative surrounding erythema, cellulitis. Negative pain upon palpation. Negative active drainage noted. Plain film of cervical spine negative for acute osseous injury. Shoulder plain film of the right side degenerative changes identified noted in the acromioclavicular joint with joint space narrowing and osteophyte formation-negative for fracture. CT head negative. Doubt peritonsillar abscess. Doubt meningitis. Doubt stroke. Doubt shingles since rashes been intermittent for the past 2 years-suspicion to be possible eczema/dermatitis. Neck pain with radiation down the right arm has been ongoing for the past year intermittently-suspicion to be possible osteoarthritis with radicular pain. Pain upon palpation and pain with motion appears to be muscular. Will discharge patient with antiinflammatories and muscle relaxer. Hydrocortisone cream  for rash. Referred patient to urgent care, neurosurgery, dermatology. Discussed with patient to rest and stay hydrated. Discussed with patient to closely monitor symptoms and if symptoms are to worsen or  change to report back to the ED - strict return instructions given.  Patient agreed to plan of care, understood, all questions answered.   Jamse Mead, PA-C 11/21/13 1322

## 2013-11-22 NOTE — ED Provider Notes (Signed)
Medical screening examination/treatment/procedure(s) were performed by non-physician practitioner and as supervising physician I was immediately available for consultation/collaboration.   EKG Interpretation None        Sharyon Cable, MD 11/22/13 [phone]

## 2014-03-23 ENCOUNTER — Ambulatory Visit: Payer: Medicaid Other | Admitting: Obstetrics & Gynecology

## 2014-06-02 ENCOUNTER — Emergency Department (HOSPITAL_COMMUNITY): Payer: Medicaid Other

## 2014-06-02 ENCOUNTER — Emergency Department (HOSPITAL_COMMUNITY)
Admission: EM | Admit: 2014-06-02 | Discharge: 2014-06-02 | Disposition: A | Discharge status: Home / Self Care | Payer: Self-pay | Attending: Emergency Medicine | Admitting: Emergency Medicine

## 2014-06-02 ENCOUNTER — Encounter (HOSPITAL_COMMUNITY): Payer: Self-pay | Admitting: Emergency Medicine

## 2014-06-02 DIAGNOSIS — R109 Unspecified abdominal pain: Secondary | ICD-10-CM | POA: Insufficient documentation

## 2014-06-02 DIAGNOSIS — Z8679 Personal history of other diseases of the circulatory system: Secondary | ICD-10-CM | POA: Insufficient documentation

## 2014-06-02 DIAGNOSIS — J45901 Unspecified asthma with (acute) exacerbation: Secondary | ICD-10-CM | POA: Insufficient documentation

## 2014-06-02 DIAGNOSIS — Z79899 Other long term (current) drug therapy: Secondary | ICD-10-CM | POA: Insufficient documentation

## 2014-06-02 DIAGNOSIS — Z3202 Encounter for pregnancy test, result negative: Secondary | ICD-10-CM | POA: Insufficient documentation

## 2014-06-02 DIAGNOSIS — Z8739 Personal history of other diseases of the musculoskeletal system and connective tissue: Secondary | ICD-10-CM | POA: Insufficient documentation

## 2014-06-02 LAB — COMPREHENSIVE METABOLIC PANEL
ALBUMIN: 3.6 g/dL (ref 3.5–5.2)
ALT: 11 U/L (ref 0–35)
AST: 18 U/L (ref 0–37)
Alkaline Phosphatase: 62 U/L (ref 39–117)
Anion gap: 11 (ref 5–15)
BUN: 9 mg/dL (ref 6–23)
CALCIUM: 9.5 mg/dL (ref 8.4–10.5)
CO2: 29 meq/L (ref 19–32)
Chloride: 104 mEq/L (ref 96–112)
Creatinine, Ser: 0.8 mg/dL (ref 0.50–1.10)
GFR calc Af Amer: >90 mL/min (ref >90)
GFR, EST NON AFRICAN AMERICAN: 85 mL/min — AB (ref >90)
Glucose, Bld: 92 mg/dL (ref 70–99)
Potassium: 3.7 mEq/L (ref 3.7–5.3)
SODIUM: 144 meq/L (ref 137–147)
TOTAL PROTEIN: 7.4 g/dL (ref 6.0–8.3)
Total Bilirubin: <0.2 mg/dL — ABNORMAL LOW (ref 0.3–1.2)

## 2014-06-02 LAB — CBC WITH DIFFERENTIAL/PLATELET
BASOS ABS: 0 10*3/uL (ref 0.0–0.1)
BASOS PCT: 1 % (ref 0–1)
EOS PCT: 1 % (ref 0–5)
Eosinophils Absolute: 0 10*3/uL (ref 0.0–0.7)
HCT: 37.8 % (ref 36.0–46.0)
Hemoglobin: 12.4 g/dL (ref 12.0–15.0)
LYMPHS PCT: 42 % (ref 12–46)
Lymphs Abs: 1.6 10*3/uL (ref 0.7–4.0)
MCH: 26.4 pg (ref 26.0–34.0)
MCHC: 32.8 g/dL (ref 30.0–36.0)
MCV: 80.4 fL (ref 78.0–100.0)
Monocytes Absolute: 0.2 10*3/uL (ref 0.1–1.0)
Monocytes Relative: 6 % (ref 3–12)
Neutro Abs: 2 10*3/uL (ref 1.7–7.7)
Neutrophils Relative %: 50 % (ref 43–77)
Platelets: 177 10*3/uL (ref 150–400)
RBC: 4.7 MIL/uL (ref 3.87–5.11)
RDW: 13.6 % (ref 11.5–15.5)
WBC: 3.9 10*3/uL — AB (ref 4.0–10.5)

## 2014-06-02 LAB — URINE MICROSCOPIC-ADD ON

## 2014-06-02 LAB — LIPASE, BLOOD: LIPASE: 25 U/L (ref 11–59)

## 2014-06-02 LAB — URINALYSIS, ROUTINE W REFLEX MICROSCOPIC
Bilirubin Urine: NEGATIVE
GLUCOSE, UA: NEGATIVE mg/dL
Ketones, ur: NEGATIVE mg/dL
Leukocytes, UA: NEGATIVE
NITRITE: NEGATIVE
PH: 6.5 (ref 5.0–8.0)
Protein, ur: NEGATIVE mg/dL
SPECIFIC GRAVITY, URINE: 1.019 (ref 1.005–1.030)
Urobilinogen, UA: 0.2 mg/dL (ref 0.0–1.0)

## 2014-06-02 LAB — POC URINE PREG, ED: PREG TEST UR: NEGATIVE

## 2014-06-02 MED ORDER — FENTANYL CITRATE 0.05 MG/ML IJ SOLN
50.0000 ug | Freq: Once | INTRAMUSCULAR | Status: AC
  Administered 2014-06-02: 50 ug via INTRAVENOUS
  Filled 2014-06-02: qty 2

## 2014-06-02 NOTE — ED Provider Notes (Signed)
CSN: [id number]     Arrival date & time 06/02/14  1126 History   First MD Initiated Contact with Patient 06/02/14 1453     Chief Complaint  Patient presents with  . Abdominal Pain     (Consider location/radiation/quality/duration/timing/severity/associated sxs/prior Treatment) HPI Comments: Patient is a 48 yo female past medical history significant for asthma, headache, arthritis presenting to the emergency department for left flank pain with radiation into left abdomen over the last 3 days. Describes pain as waxing and waning in nature. No alleviating or aggravating factors. Denies any fevers, chills, nausea, vomiting, diarrhea, urinary symptoms. Surgical history includes cesarean section, total hysterectomy.  Patient is a 48 y.o. female presenting with abdominal pain. The history is provided by the patient. The history is limited by a language barrier. A language interpreter was used.  Abdominal Pain   Past Medical History  Diagnosis Date  . Asthma   . Headache(784.0)   . Arthritis     rt shoulder  . Shortness of breath     on exertion  . Dysrhythmia     sometimes on exertion   Past Surgical History  Procedure Laterality Date  . Cesarean section  1995  . Robotic assisted total hysterectomy N/A 05/15/2013    Procedure: ATTEMPTED ROBOTIC TOTAL HYSTERECTOMY;  Surgeon: Lavonia Drafts, MD;  Location: [facility];  Service: Gynecology;  Laterality: N/A;  . Abdominal hysterectomy Bilateral 05/15/2013    Procedure: HYSTERECTOMY ABDOMINAL WITH BILATERAL SALPINGECTOMY;  Surgeon: Lavonia Drafts, MD;  Location: [facility];  Service: Gynecology;  Laterality: Bilateral;   No family history on file. History  Substance Use Topics  . Smoking status: Never Smoker   . Smokeless tobacco: Never Used  . Alcohol Use: No   OB History   Grav Para Term Preterm Abortions TAB SAB Ect Mult Living   11 5 1 4 6  6   5      Review of Systems  Gastrointestinal: Positive for abdominal pain.    Genitourinary: Positive for flank pain.  All other systems reviewed and are negative.     Allergies  Review of patient's allergies indicates no known allergies.  Home Medications   Prior to Admission medications   Medication Sig Start Date End Date Taking? Authorizing Provider  albuterol (PROVENTIL HFA;VENTOLIN HFA) 108 (90 BASE) MCG/ACT inhaler Inhale 1-2 puffs into the lungs every 6 (six) hours as needed for wheezing. 12/04/12  Yes Nat Christen, MD  naproxen sodium (ANAPROX) 220 MG tablet Take 220 mg by mouth daily as needed (for pain).   Yes Historical Provider, MD   BP 169/96  Pulse 55  Temp(Src) 97.4 F (36.3 C) (Oral)  Resp 12  SpO2 100%  LMP 07/23/2012 Physical Exam  Nursing note and vitals reviewed. Constitutional: She is oriented to person, place, and time. She appears well-developed and well-nourished. No distress.  HENT:  Head: Normocephalic and atraumatic.  Right Ear: External ear normal.  Left Ear: External ear normal.  Nose: Nose normal.  Mouth/Throat: Oropharynx is clear and moist.  Eyes: Conjunctivae are normal.  Neck: Neck supple.  Cardiovascular: Normal rate, regular rhythm and normal heart sounds.   Pulmonary/Chest: Effort normal and breath sounds normal.  Abdominal: Soft. Bowel sounds are normal. There is tenderness. There is no rigidity, no rebound and no guarding.    Musculoskeletal:       Back:  Neurological: She is alert and oriented to person, place, and time.  Moves all extremities without ataxia.   Skin: Skin is warm and  dry. She is not diaphoretic.    ED Course  Procedures (including critical care time) Medications  fentaNYL (SUBLIMAZE) injection 50 mcg (50 mcg Intravenous Given 06/02/14 1608)    Labs Review Labs Reviewed  CBC WITH DIFFERENTIAL - Abnormal; Notable for the following:    WBC 3.9 (*)    All other components within normal limits  COMPREHENSIVE METABOLIC PANEL - Abnormal; Notable for the following:    Total Bilirubin  <0.2 (*)    GFR calc non Af Amer 85 (*)    All other components within normal limits  URINALYSIS, ROUTINE W REFLEX MICROSCOPIC - Abnormal; Notable for the following:    Hgb urine dipstick SMALL (*)    All other components within normal limits  LIPASE, BLOOD  URINE MICROSCOPIC-ADD ON  POC URINE PREG, ED    Imaging Review No results found.   EKG Interpretation None      MDM   Final diagnoses:  None    Filed Vitals:   06/02/14 1645  BP: 169/96  Pulse: 55  Temp:   Resp: 12   Afebrile, NAD, non-toxic appearing, AAOx4.  Left sided back pain with radiation into left side. Abdomen soft, non-tender, non-distended. No CVA tenderness. Small hgb in UA. Concern for kidney stone will obtain CT abd/pelv w/o contrast. Patient signed out to Comer Locket, PA-C disposition pending scan results.     Harlow Mares, PA-C 06/02/14 1719

## 2014-06-02 NOTE — ED Provider Notes (Signed)
Care signed over to me by J Piepenbrink PA-C at 5:30pm. CT scan shows no evidence of nephrolithiasis, there is an appendicolith-patient has no right lower quadrant tenderness. Advised return to ED if right lower quadrant tenderness manifests.  Pt may be discharged and can follow up with primary care or Urology. Bedside ultrasound performed by Dr. Noemi Chapel, no evidence of hydronephrosis.  Viona Gilmore [place], PA-C 06/02/14 [phone]

## 2014-06-02 NOTE — ED Notes (Addendum)
Pt here for evaluation of abdominal pain, left sided, for three days. Denies nausea, vomiting or diarrhea. Pain 10/10. Denies painful urination.

## 2014-06-02 NOTE — Discharge Instructions (Signed)

## 2014-06-02 NOTE — ED Notes (Signed)
Pt presents to department for evaluation of L sided abdominal pain. Ongoing x3 days. 8/10 pain upon arrival. Denies N/V/D. Denies urinary symptoms. Pt is alert and oriented x4.

## 2014-06-02 NOTE — ED Notes (Signed)
Was able to draw pts CBC not able to draw CMP or lipase, not enough blood return for labs ordered. Called phlebotomy  and spoke with Maudie Mercury.

## 2014-06-02 NOTE — ED Provider Notes (Signed)
Medical screening examination/treatment/procedure(s) were performed by non-physician practitioner and as supervising physician I was immediately available for consultation/collaboration.   EKG Interpretation None        Kristen N Ward, DO 06/02/14 1801 

## 2014-06-03 NOTE — ED Provider Notes (Signed)
Medical screening examination/treatment/procedure(s) were conducted as a shared visit with non-physician practitioner(s) and myself.  I personally evaluated the patient during the encounter.  L sided flank and lower L Q pain - CT neg, on my exam has mild L sided ttp and US shows no hydronephrosis on my bedside exam. Stable for d/c.  Clinical Impression:   Final diagnoses:  Flank pain     Johnna Acosta, MD 06/03/14 1009

## 2014-07-20 ENCOUNTER — Encounter (HOSPITAL_COMMUNITY): Payer: Self-pay | Admitting: Emergency Medicine

## 2014-12-15 ENCOUNTER — Encounter (HOSPITAL_COMMUNITY): Payer: Self-pay | Admitting: Emergency Medicine

## 2014-12-15 ENCOUNTER — Emergency Department (INDEPENDENT_AMBULATORY_CARE_PROVIDER_SITE_OTHER)
Admission: EM | Admit: 2014-12-15 | Discharge: 2014-12-15 | Disposition: A | Discharge status: Home / Self Care | Payer: Self-pay | Source: Home / Self Care | Attending: Emergency Medicine | Admitting: Emergency Medicine

## 2014-12-15 DIAGNOSIS — M7661 Achilles tendinitis, right leg: Secondary | ICD-10-CM

## 2014-12-15 DIAGNOSIS — M545 Low back pain, unspecified: Secondary | ICD-10-CM

## 2014-12-15 MED ORDER — MELOXICAM 7.5 MG PO TABS: 7.5000 mg | ORAL_TABLET | Freq: Every day | ORAL | Status: DC

## 2014-12-15 MED ORDER — PREDNISONE 50 MG PO TABS: ORAL_TABLET | ORAL | Status: DC

## 2014-12-15 MED ORDER — ALBUTEROL SULFATE HFA 108 (90 BASE) MCG/ACT IN AERS: 1.0000 | INHALATION_SPRAY | Freq: Four times a day (QID) | RESPIRATORY_TRACT | Status: DC | PRN

## 2014-12-15 NOTE — ED Notes (Signed)
Pt states that her right foot has been hurting for over a year pt denies any injury or fall

## 2014-12-15 NOTE — ED Provider Notes (Signed)
CSN: [id number]     Arrival date & time 12/15/14  7893 History   First MD Initiated Contact with Patient 12/15/14 [phone]     Chief Complaint  Patient presents with  . Foot Pain    right foot   (Consider location/radiation/quality/duration/timing/severity/associated sxs/prior Treatment) HPI  She is a 49 year old woman here for several complaints.  She reports intermittent right heel pain for the last year or so. She states it comes and goes. It is worse when she dorsiflexes her foot or walks on the foot. The pain is located over the Achilles tendon.  She also reports intermittent left lower back pain that radiates to the side. She has been seen at [facility] for this previously. At that time she had a CT scan that showed some lumbar arthritis, but no signs of kidney stones.  Past Medical History  Diagnosis Date  . Asthma   . Headache(784.0)   . Arthritis     rt shoulder  . Shortness of breath     on exertion  . Dysrhythmia     sometimes on exertion   Past Surgical History  Procedure Laterality Date  . Cesarean section  1995  . Robotic assisted total hysterectomy N/A 05/15/2013    Procedure: ATTEMPTED ROBOTIC TOTAL HYSTERECTOMY;  Surgeon: Lavonia Drafts, MD;  Location: [facility];  Service: Gynecology;  Laterality: N/A;  . Abdominal hysterectomy Bilateral 05/15/2013    Procedure: HYSTERECTOMY ABDOMINAL WITH BILATERAL SALPINGECTOMY;  Surgeon: Lavonia Drafts, MD;  Location: [facility];  Service: Gynecology;  Laterality: Bilateral;   History reviewed. No pertinent family history. History  Substance Use Topics  . Smoking status: Never Smoker   . Smokeless tobacco: Never Used  . Alcohol Use: No   OB History    Gravida Para Term Preterm AB TAB SAB Ectopic Multiple Living   11 5 1 4 6  6   5      Review of Systems As in history of present illness Allergies  Review of patient's allergies indicates no known allergies.  Home Medications   Prior to Admission medications    Medication Sig Start Date End Date Taking? Authorizing Provider  albuterol (PROVENTIL HFA;VENTOLIN HFA) 108 (90 BASE) MCG/ACT inhaler Inhale 1-2 puffs into the lungs every 6 (six) hours as needed for wheezing. 12/15/14   Melony Overly, MD  meloxicam (MOBIC) 7.5 MG tablet Take 1 tablet (7.5 mg total) by mouth daily. 12/15/14   Melony Overly, MD  naproxen sodium (ANAPROX) 220 MG tablet Take 220 mg by mouth daily as needed (for pain).    Historical Provider, MD  predniSONE (DELTASONE) 50 MG tablet Take 1 pill daily for 5 days. 12/15/14   Melony Overly, MD   BP 132/72 mmHg  Pulse 72  Temp(Src) 98 F (36.7 C) (Oral)  Resp 16  SpO2 100%  LMP 07/23/2012 Physical Exam  Constitutional: She is oriented to person, place, and time. She appears well-developed and well-nourished. No distress.  Cardiovascular: Normal rate.   Pulmonary/Chest: Effort normal.  Musculoskeletal:  Back: No vertebral tenderness or step-offs. She has some tenderness and spasm in the left lumbar paraspinous muscles. Right foot: 2+ DP pulse. She has some swelling around the Achilles tendon. She is tender over the Achilles tendon. She has good strength in her foot.  Neurological: She is alert and oriented to person, place, and time.    ED Course  Procedures (including critical care time) Labs Review Labs Reviewed - No data to display  Imaging Review  No results found.   MDM   1. Left-sided low back pain without sciatica   2. Right Achilles tendinitis    We'll treat with a five-day course of prednisone. Meloxicam daily for 1 week. She can repeat this at the start of additional pain flares. I did provide a prescription for albuterol inhaler for her to use for her asthma.    Melony Overly, MD 12/15/14 1052

## 2014-12-15 NOTE — Discharge Instructions (Signed)
Your back and side pain is from arthritis. The right foot pain is from Achilles tendinitis. Take prednisone 1 pill daily for 5 days. Take meloxicam 1 pill daily for the next 7 days. You can repeat this medicine the next time the pain flares up. Apply ice to your right heel.  I have provided a prescription for an inhaler to use when you get short of breath.  You will need to find a primary care doctor who can follow your asthma and back pain over time.  If your symptoms change or worsen, please go to the emergency room.

## 2015-02-10 ENCOUNTER — Emergency Department (HOSPITAL_COMMUNITY): Payer: Medicaid Other

## 2015-02-10 ENCOUNTER — Emergency Department (HOSPITAL_COMMUNITY)
Admission: EM | Admit: 2015-02-10 | Discharge: 2015-02-10 | Disposition: A | Discharge status: Home / Self Care | Payer: Medicaid Other | Attending: Emergency Medicine | Admitting: Emergency Medicine

## 2015-02-10 ENCOUNTER — Encounter (HOSPITAL_COMMUNITY): Payer: Self-pay | Admitting: *Deleted

## 2015-02-10 DIAGNOSIS — Y929 Unspecified place or not applicable: Secondary | ICD-10-CM | POA: Insufficient documentation

## 2015-02-10 DIAGNOSIS — Z791 Long term (current) use of non-steroidal anti-inflammatories (NSAID): Secondary | ICD-10-CM | POA: Insufficient documentation

## 2015-02-10 DIAGNOSIS — S99911A Unspecified injury of right ankle, initial encounter: Secondary | ICD-10-CM | POA: Insufficient documentation

## 2015-02-10 DIAGNOSIS — Y999 Unspecified external cause status: Secondary | ICD-10-CM | POA: Insufficient documentation

## 2015-02-10 DIAGNOSIS — W109XXA Fall (on) (from) unspecified stairs and steps, initial encounter: Secondary | ICD-10-CM | POA: Insufficient documentation

## 2015-02-10 DIAGNOSIS — S8991XA Unspecified injury of right lower leg, initial encounter: Secondary | ICD-10-CM | POA: Insufficient documentation

## 2015-02-10 DIAGNOSIS — Y9301 Activity, walking, marching and hiking: Secondary | ICD-10-CM | POA: Insufficient documentation

## 2015-02-10 DIAGNOSIS — M1990 Unspecified osteoarthritis, unspecified site: Secondary | ICD-10-CM | POA: Insufficient documentation

## 2015-02-10 DIAGNOSIS — M25561 Pain in right knee: Secondary | ICD-10-CM

## 2015-02-10 DIAGNOSIS — J45909 Unspecified asthma, uncomplicated: Secondary | ICD-10-CM | POA: Insufficient documentation

## 2015-02-10 DIAGNOSIS — Z7952 Long term (current) use of systemic steroids: Secondary | ICD-10-CM | POA: Insufficient documentation

## 2015-02-10 MED ORDER — NAPROXEN 500 MG PO TABS: 500.0000 mg | ORAL_TABLET | Freq: Two times a day (BID) | ORAL | Status: DC

## 2015-02-10 NOTE — ED Notes (Signed)
Per x ray tech, proximal fibula fracture to be read by radiologist.

## 2015-02-10 NOTE — ED Notes (Signed)
Right knee pain after fall this am. No deformity.

## 2015-02-10 NOTE — ED Notes (Signed)
Pt reports falling today, now has right ankle and right knee pain.

## 2015-02-10 NOTE — ED Provider Notes (Signed)
CSN: [id number]     Arrival date & time 02/10/15  1220 History  This chart was scribed for Robin Bible, PA-C, working with Lacretia Leigh, MD by Girtha Hake, ED Scribe. The patient was seen in [facility]. The patient's care was started at 1:27 PM.     Chief Complaint  Patient presents with  . Fall   Patient is a 49 y.o. female presenting with fall. The history is provided by the patient. No language interpreter was used.  Fall   HPI Comments: [name] is a 49 y.o. female who presents to the Emergency Department complaining of a fall that occurred this morning while walking down the stairs. She states that she landed on her right knee when she fell. Patient complains of right knee and right ankle pain. She experienced ankle pain prior to the fall. Patient was ambulatory after the fall. She denies numbness, LOC, or hitting her head.  She has taken Aleve for the pain with mild relief.     Past Medical History  Diagnosis Date  . Asthma   . Headache(784.0)   . Arthritis     rt shoulder  . Shortness of breath     on exertion  . Dysrhythmia     sometimes on exertion   Past Surgical History  Procedure Laterality Date  . Cesarean section  1995  . Robotic assisted total hysterectomy N/A 05/15/2013    Procedure: ATTEMPTED ROBOTIC TOTAL HYSTERECTOMY;  Surgeon: Lavonia Drafts, MD;  Location: [facility];  Service: Gynecology;  Laterality: N/A;  . Abdominal hysterectomy Bilateral 05/15/2013    Procedure: HYSTERECTOMY ABDOMINAL WITH BILATERAL SALPINGECTOMY;  Surgeon: Lavonia Drafts, MD;  Location: [facility];  Service: Gynecology;  Laterality: Bilateral;   History reviewed. No pertinent family history. History  Substance Use Topics  . Smoking status: Never Smoker   . Smokeless tobacco: Never Used  . Alcohol Use: No   OB History    Gravida Para Term Preterm AB TAB SAB Ectopic Multiple Living   11 5 1 4 6  6   5      Review of Systems  Musculoskeletal: Positive  for arthralgias.  Neurological: Negative for syncope and numbness.      Allergies  Review of patient's allergies indicates no known allergies.  Home Medications   Prior to Admission medications   Medication Sig Start Date End Date Taking? Authorizing Provider  albuterol (PROVENTIL HFA;VENTOLIN HFA) 108 (90 BASE) MCG/ACT inhaler Inhale 1-2 puffs into the lungs every 6 (six) hours as needed for wheezing. 12/15/14   Melony Overly, MD  meloxicam (MOBIC) 7.5 MG tablet Take 1 tablet (7.5 mg total) by mouth daily. 12/15/14   Melony Overly, MD  naproxen sodium (ANAPROX) 220 MG tablet Take 220 mg by mouth daily as needed (for pain).    Historical Provider, MD  predniSONE (DELTASONE) 50 MG tablet Take 1 pill daily for 5 days. 12/15/14   Melony Overly, MD   Triage Vitals: BP 141/83 mmHg  Pulse 72  Temp(Src) 98.5 F (36.9 C) (Oral)  Resp 18  SpO2 96%  LMP 07/23/2012 Physical Exam  Constitutional: She is oriented to person, place, and time. She appears well-developed and well-nourished. No distress.  HENT:  Head: Normocephalic and atraumatic.  Eyes: Conjunctivae and EOM are normal.  Neck: Neck supple. No tracheal deviation present.  Cardiovascular: Normal rate and regular rhythm.   Pulmonary/Chest: Effort normal and breath sounds normal. No respiratory distress.  Musculoskeletal: Normal range of motion. She exhibits tenderness.  She exhibits no edema.  TTP to lateral aspect of the right knee. No bruising, erythema, or edema. TTP of the right lateral malleolus. TTP of the right achilles and right heel. Achilles tendon appears intact. 2+ DP pulse in the right foot. Distal sensation of the right foot intact. Full ROM of the ankle. Negative Thompson sign on the right.  Neurological: She is alert and oriented to person, place, and time.  Skin: Skin is warm and dry. No erythema.  Psychiatric: She has a normal mood and affect. Her behavior is normal.  Nursing note and vitals reviewed.   ED Course   Procedures (including critical care time) DIAGNOSTIC STUDIES: Oxygen Saturation is 96% on room air, adequate by my interpretation.    COORDINATION OF CARE:    Labs Review Labs Reviewed - No data to display  Imaging Review Dg Ankle Complete Right  02/10/2015   CLINICAL DATA:  Status post fall today. Right ankle pain and difficulty bearing weight. Initial encounter.  EXAM: RIGHT ANKLE - COMPLETE 3+ VIEW  COMPARISON:  None.  FINDINGS: No acute bony or joint abnormality is identified. No notable degenerative change is seen. No tibiotalar joint effusion is noted. Calcaneal spurring is identified. Soft tissues are unremarkable.  IMPRESSION: No acute abnormality.   Electronically Signed   By: Inge Rise M.D.   On: 02/10/2015 13:08   Dg Knee Complete 4 Views Right  02/10/2015   CLINICAL DATA:  Status post fall after the patient's right leg gave way today. Right knee pain. Initial encounter.  EXAM: RIGHT KNEE - COMPLETE 4+ VIEW  COMPARISON:  None.  FINDINGS: No acute bony or joint abnormality is identified. No notable degenerative change is seen about the right knee. There is no joint effusion. Mild enthesopathy about the patella is noted.  IMPRESSION: No acute finding.   Electronically Signed   By: Inge Rise M.D.   On: 02/10/2015 13:13     EKG Interpretation None      MDM   Final diagnoses:  None   Patient presenting with pain of the right knee and right ankle that has been present since a fall that occurred earlier today.  Xray of ankle and knee is negative today.  Patient is ambulatory.  Neurovascularly intact.  Patient stable for discharge.  Return precautions given.  I personally performed the services described in this documentation, which was scribed in my presence. The recorded information has been reviewed and is accurate.    Robin Bible, PA-C 02/10/15 1522  Lacretia Leigh, MD 02/13/15 [phone]

## 2015-04-18 ENCOUNTER — Emergency Department (HOSPITAL_COMMUNITY)
Admission: EM | Admit: 2015-04-18 | Discharge: 2015-04-18 | Disposition: A | Discharge status: Home / Self Care | Payer: Medicaid Other | Attending: Emergency Medicine | Admitting: Emergency Medicine

## 2015-04-18 ENCOUNTER — Encounter (HOSPITAL_COMMUNITY): Payer: Self-pay | Admitting: Emergency Medicine

## 2015-04-18 DIAGNOSIS — Z7952 Long term (current) use of systemic steroids: Secondary | ICD-10-CM | POA: Insufficient documentation

## 2015-04-18 DIAGNOSIS — M19011 Primary osteoarthritis, right shoulder: Secondary | ICD-10-CM | POA: Insufficient documentation

## 2015-04-18 DIAGNOSIS — J45909 Unspecified asthma, uncomplicated: Secondary | ICD-10-CM | POA: Insufficient documentation

## 2015-04-18 DIAGNOSIS — Z8679 Personal history of other diseases of the circulatory system: Secondary | ICD-10-CM | POA: Insufficient documentation

## 2015-04-18 DIAGNOSIS — R42 Dizziness and giddiness: Secondary | ICD-10-CM | POA: Insufficient documentation

## 2015-04-18 DIAGNOSIS — Z791 Long term (current) use of non-steroidal anti-inflammatories (NSAID): Secondary | ICD-10-CM | POA: Insufficient documentation

## 2015-04-18 DIAGNOSIS — R112 Nausea with vomiting, unspecified: Secondary | ICD-10-CM | POA: Insufficient documentation

## 2015-04-18 MED ORDER — SODIUM CHLORIDE 0.9 % IV BOLUS (SEPSIS)
1000.0000 mL | Freq: Once | INTRAVENOUS | Status: AC
  Administered 2015-04-18: 1000 mL via INTRAVENOUS

## 2015-04-18 MED ORDER — MECLIZINE HCL 25 MG PO TABS: 25.0000 mg | ORAL_TABLET | Freq: Three times a day (TID) | ORAL | Status: DC | PRN

## 2015-04-18 MED ORDER — LORAZEPAM 2 MG/ML IJ SOLN
1.0000 mg | Freq: Once | INTRAMUSCULAR | Status: AC
  Administered 2015-04-18: 1 mg via INTRAVENOUS
  Filled 2015-04-18: qty 1

## 2015-04-18 MED ORDER — ONDANSETRON 8 MG PO TBDP: 8.0000 mg | ORAL_TABLET | Freq: Three times a day (TID) | ORAL | Status: DC | PRN

## 2015-04-18 MED ORDER — ONDANSETRON HCL 4 MG/2ML IJ SOLN
4.0000 mg | Freq: Once | INTRAMUSCULAR | Status: AC
  Administered 2015-04-18: 4 mg via INTRAVENOUS
  Filled 2015-04-18: qty 2

## 2015-04-18 MED ORDER — MECLIZINE HCL 25 MG PO TABS
25.0000 mg | ORAL_TABLET | Freq: Once | ORAL | Status: AC
  Administered 2015-04-18: 25 mg via ORAL
  Filled 2015-04-18: qty 1

## 2015-04-18 NOTE — Discharge Instructions (Signed)
Benign Positional Vertigo Vertigo means you feel like you or your surroundings are moving when they are not. Benign positional vertigo is the most common form of vertigo. Benign means that the cause of your condition is not serious. Benign positional vertigo is more common in older adults. CAUSES  Benign positional vertigo is the result of an upset in the labyrinth system. This is an area in the middle ear that helps control your balance. This may be caused by a viral infection, head injury, or repetitive motion. However, often no specific cause is found. SYMPTOMS  Symptoms of benign positional vertigo occur when you move your head or eyes in different directions. Some of the symptoms may include:  Loss of balance and falls.  Vomiting.  Blurred vision.  Dizziness.  Nausea.  Involuntary eye movements (nystagmus). DIAGNOSIS  Benign positional vertigo is usually diagnosed by physical exam. If the specific cause of your benign positional vertigo is unknown, your caregiver may perform imaging tests, such as magnetic resonance imaging (MRI) or computed tomography (CT). TREATMENT  Your caregiver may recommend movements or procedures to correct the benign positional vertigo. Medicines such as meclizine, benzodiazepines, and medicines for nausea may be used to treat your symptoms. In rare cases, if your symptoms are caused by certain conditions that affect the inner ear, you may need surgery. HOME CARE INSTRUCTIONS   Follow your caregiver's instructions.  Move slowly. Do not make sudden body or head movements.  Avoid driving.  Avoid operating heavy machinery.  Avoid performing any tasks that would be dangerous to you or others during a vertigo episode.  Drink enough fluids to keep your urine clear or pale yellow. SEEK IMMEDIATE MEDICAL CARE IF:   You develop problems with walking, weakness, numbness, or using your arms, hands, or legs.  You have difficulty speaking.  You develop  severe headaches.  Your nausea or vomiting continues or gets worse.  You develop visual changes.  Your family or friends notice any behavioral changes.  Your condition gets worse.  You have a fever.  You develop a stiff neck or sensitivity to light. MAKE SURE YOU:   Understand these instructions.  Will watch your condition.  Will get help right away if you are not doing well or get worse. Document Released: 06/12/2006 Document Revised: 11/27/2011 Document Reviewed: 05/25/2011 Exit[employer] Patient Information 2015 [employer]. This information is not intended to replace advice given to you by your health care provider. Make sure you discuss any questions you have with your health care provider.    

## 2015-04-18 NOTE — ED Notes (Signed)
Pt. Ambulated in hall with Probation officer and RN Susanna with minimal stand by assist. Pt. Still states still has dizziness but has improved. No cp/sob noted.

## 2015-04-18 NOTE — ED Notes (Signed)
Dizziness since yesterday whenever moving or standing.  Nausea noted with movement.  No vomiting.  Given zofran 4mg  IV enroute.

## 2015-04-18 NOTE — ED Provider Notes (Signed)
CSN: [id number]     Arrival date & time 04/18/15  0021 History  This chart was scribed for [name], MD by Randa Evens, ED Scribe. This patient was seen in room A05C/A05C and the patient's care was started at 12:36 AM.    Chief Complaint  Patient presents with  . Dizziness   The history is provided by the patient. No language interpreter was used.   HPI Comments: [name] is a 49 y.o. female brought in by ambulance, who presents to the Emergency Department complaining of dizziness described as everything is spinning onset 1 day prior. Pt states that she has associated nausea and vomiting x1. She states that the dizziness is worse when moving her head. Pt states she has a Hx of vertigo several years prior.   Past Medical History  Diagnosis Date  . Asthma   . Headache(784.0)   . Arthritis     rt shoulder  . Shortness of breath     on exertion  . Dysrhythmia     sometimes on exertion   Past Surgical History  Procedure Laterality Date  . Cesarean section  1995  . Robotic assisted total hysterectomy N/A 05/15/2013    Procedure: ATTEMPTED ROBOTIC TOTAL HYSTERECTOMY;  Surgeon: Lavonia Drafts, MD;  Location: [facility];  Service: Gynecology;  Laterality: N/A;  . Abdominal hysterectomy Bilateral 05/15/2013    Procedure: HYSTERECTOMY ABDOMINAL WITH BILATERAL SALPINGECTOMY;  Surgeon: Lavonia Drafts, MD;  Location: [facility];  Service: Gynecology;  Laterality: Bilateral;   No family history on file. History  Substance Use Topics  . Smoking status: Never Smoker   . Smokeless tobacco: Never Used  . Alcohol Use: No   OB History    Gravida Para Term Preterm AB TAB SAB Ectopic Multiple Living   11 5 1 4 6  6   5       Review of Systems  Gastrointestinal: Positive for nausea and vomiting.  Neurological: Positive for dizziness.  All other systems reviewed and are negative.    Allergies  Review of patient's allergies indicates no known allergies.  Home  Medications   Prior to Admission medications   Medication Sig Start Date End Date Taking? Authorizing Provider  albuterol (PROVENTIL HFA;VENTOLIN HFA) 108 (90 BASE) MCG/ACT inhaler Inhale 1-2 puffs into the lungs every 6 (six) hours as needed for wheezing. 12/15/14   Melony Overly, MD  meloxicam (MOBIC) 7.5 MG tablet Take 1 tablet (7.5 mg total) by mouth daily. 12/15/14   Melony Overly, MD  naproxen (NAPROSYN) 500 MG tablet Take 1 tablet (500 mg total) by mouth 2 (two) times daily. 02/10/15   Heather Laisure, PA-C  naproxen sodium (ANAPROX) 220 MG tablet Take 220 mg by mouth daily as needed (for pain).    Historical Provider, MD  predniSONE (DELTASONE) 50 MG tablet Take 1 pill daily for 5 days. 12/15/14   Melony Overly, MD   BP 157/78 mmHg  Pulse 68  Temp(Src) 98.2 F (36.8 C) (Oral)  Resp 18  Ht 5\' 4"  (1.626 m)  Wt 188 lb (85.276 kg)  BMI 32.25 kg/m2  SpO2 99%  LMP 07/23/2012   Physical Exam  Constitutional: She is oriented to person, place, and time. She appears well-developed and well-nourished. No distress.  HENT:  Head: Normocephalic and atraumatic.  Eyes: EOM are normal. Pupils are equal, round, and reactive to light.  Neck: Normal range of motion.  Cardiovascular: Normal rate, regular rhythm and normal heart sounds.   Pulmonary/Chest: Effort normal  and breath sounds normal.  Abdominal: Soft. She exhibits no distension. There is no tenderness.  Musculoskeletal: Normal range of motion.  Neurological: She is alert and oriented to person, place, and time.  5/5 strength in major muscle groups of  bilateral upper and lower extremities. Speech normal. No facial asymetry.   Skin: Skin is warm and dry.  Psychiatric: She has a normal mood and affect. Judgment normal.  Nursing note and vitals reviewed.   ED Course  Procedures (including critical care time) DIAGNOSTIC STUDIES: Oxygen Saturation is 99% on RA, normal by my interpretation.    COORDINATION OF CARE: 1:08 AM-Discussed  treatment plan with pt at bedside and pt agreed to plan.     Labs Review Labs Reviewed - No data to display  Imaging Review No results found.   EKG Interpretation None      MDM   Final diagnoses:  None   6:19 AM Patient is feeling better at this time.  Discharge home in good condition.  Ambulate in the hall.  Suspect peripheral vertigo.  Discharge home with meclizine.  Patient understands return the ER for new or worsening symptoms.  No other complaints.   I personally performed the services described in this documentation, which was scribed in my presence. The recorded information has been reviewed and is accurate.        [name], MD 04/18/15 [phone]

## 2015-04-18 NOTE — ED Notes (Addendum)
While removing pt. From bedpan, pt. Asked this writer to look at a spot on her lower back/upper left buttcheek. PT. States the area "comes and goes", "fire hot", and itches. Language barrier noted. Area appears raised and swollen. nurse and Dr. Ree Kida.

## 2015-07-03 ENCOUNTER — Emergency Department (HOSPITAL_COMMUNITY): Payer: Medicaid Other

## 2015-07-03 ENCOUNTER — Encounter (HOSPITAL_COMMUNITY): Payer: Self-pay | Admitting: Vascular Surgery

## 2015-07-03 ENCOUNTER — Emergency Department (HOSPITAL_COMMUNITY)
Admission: EM | Admit: 2015-07-03 | Discharge: 2015-07-03 | Disposition: A | Discharge status: Home / Self Care | Payer: Self-pay | Attending: Emergency Medicine | Admitting: Emergency Medicine

## 2015-07-03 DIAGNOSIS — M546 Pain in thoracic spine: Secondary | ICD-10-CM | POA: Insufficient documentation

## 2015-07-03 DIAGNOSIS — R1032 Left lower quadrant pain: Secondary | ICD-10-CM | POA: Insufficient documentation

## 2015-07-03 DIAGNOSIS — M79672 Pain in left foot: Secondary | ICD-10-CM | POA: Insufficient documentation

## 2015-07-03 DIAGNOSIS — M1990 Unspecified osteoarthritis, unspecified site: Secondary | ICD-10-CM | POA: Insufficient documentation

## 2015-07-03 DIAGNOSIS — R109 Unspecified abdominal pain: Secondary | ICD-10-CM

## 2015-07-03 DIAGNOSIS — R300 Dysuria: Secondary | ICD-10-CM | POA: Insufficient documentation

## 2015-07-03 DIAGNOSIS — M79671 Pain in right foot: Secondary | ICD-10-CM | POA: Insufficient documentation

## 2015-07-03 DIAGNOSIS — J45909 Unspecified asthma, uncomplicated: Secondary | ICD-10-CM | POA: Insufficient documentation

## 2015-07-03 DIAGNOSIS — M545 Low back pain: Secondary | ICD-10-CM | POA: Insufficient documentation

## 2015-07-03 LAB — CBC WITH DIFFERENTIAL/PLATELET
BASOS ABS: 0 10*3/uL (ref 0.0–0.1)
BASOS PCT: 0 %
EOS PCT: 1 %
Eosinophils Absolute: 0 10*3/uL (ref 0.0–0.7)
HCT: 37.5 % (ref 36.0–46.0)
Hemoglobin: 12.1 g/dL (ref 12.0–15.0)
Lymphocytes Relative: 34 %
Lymphs Abs: 1.8 10*3/uL (ref 0.7–4.0)
MCH: 26.4 pg (ref 26.0–34.0)
MCHC: 32.3 g/dL (ref 30.0–36.0)
MCV: 81.9 fL (ref 78.0–100.0)
MONO ABS: 0.5 10*3/uL (ref 0.1–1.0)
Monocytes Relative: 9 %
Neutro Abs: 2.9 10*3/uL (ref 1.7–7.7)
Neutrophils Relative %: 56 %
PLATELETS: 183 10*3/uL (ref 150–400)
RBC: 4.58 MIL/uL (ref 3.87–5.11)
RDW: 13.5 % (ref 11.5–15.5)
WBC: 5.2 10*3/uL (ref 4.0–10.5)

## 2015-07-03 LAB — URINALYSIS, ROUTINE W REFLEX MICROSCOPIC
BILIRUBIN URINE: NEGATIVE
Glucose, UA: NEGATIVE mg/dL
KETONES UR: NEGATIVE mg/dL
Leukocytes, UA: NEGATIVE
Nitrite: NEGATIVE
PROTEIN: NEGATIVE mg/dL
Specific Gravity, Urine: 1.023 (ref 1.005–1.030)
Urobilinogen, UA: 0.2 mg/dL (ref 0.0–1.0)
pH: 6 (ref 5.0–8.0)

## 2015-07-03 LAB — COMPREHENSIVE METABOLIC PANEL
ALBUMIN: 3.5 g/dL (ref 3.5–5.0)
ALT: 15 U/L (ref 14–54)
AST: 24 U/L (ref 15–41)
Alkaline Phosphatase: 60 U/L (ref 38–126)
Anion gap: 9 (ref 5–15)
CO2: 26 mmol/L (ref 22–32)
Calcium: 9.1 mg/dL (ref 8.9–10.3)
Chloride: 103 mmol/L (ref 101–111)
Creatinine, Ser: 0.75 mg/dL (ref 0.44–1.00)
GFR calc Af Amer: >60 mL/min (ref >60)
Glucose, Bld: 120 mg/dL — ABNORMAL HIGH (ref 65–99)
Potassium: 3.7 mmol/L (ref 3.5–5.1)
Sodium: 138 mmol/L (ref 135–145)
Total Bilirubin: 0.4 mg/dL (ref 0.3–1.2)
Total Protein: 6.6 g/dL (ref 6.5–8.1)

## 2015-07-03 LAB — URINE MICROSCOPIC-ADD ON

## 2015-07-03 MED ORDER — CYCLOBENZAPRINE HCL 10 MG PO TABS: 10.0000 mg | ORAL_TABLET | Freq: Two times a day (BID) | ORAL | Status: DC | PRN

## 2015-07-03 MED ORDER — HYDROCODONE-ACETAMINOPHEN 5-325 MG PO TABS
2.0000 | ORAL_TABLET | Freq: Once | ORAL | Status: AC
  Administered 2015-07-03: 2 via ORAL
  Filled 2015-07-03: qty 2

## 2015-07-03 MED ORDER — NAPROXEN 500 MG PO TABS: 500.0000 mg | ORAL_TABLET | Freq: Two times a day (BID) | ORAL | Status: DC

## 2015-07-03 NOTE — ED Notes (Signed)
Pt reports to the ED for eval of left flank and left lower back pain x several weeks however it is getting progressively worse. She is also having bilateral feet pain. She does do a lot of standing at work. Pt also reports that the pain is so severe that it makes it hard to breathe. She has decreased urination but denies any dysuria or urgency. Denies any numbness, tingling, or paralysis. Pt A&Ox4, resp e/u, and skin warm and dry.

## 2015-07-03 NOTE — ED Notes (Signed)
Pt stable, ambulatory, states understanding of discharge instructions 

## 2015-07-03 NOTE — Discharge Instructions (Signed)
- Take naproxen for pain control - Take flexeril as a muscle relaxer - Call [facility] or one of the other primary care clinics offered in the resource guide to establish primary care and set up a follow up appointment   Emergency Department Resource Guide 1) Find a Doctor and Pay Out of Pocket Although you won't have to find out who is covered by your insurance plan, it is a good idea to ask around and get recommendations. You will then need to call the office and see if the doctor you have chosen will accept you as a new patient and what types of options they offer for patients who are self-pay. Some doctors offer discounts or will set up payment plans for their patients who do not have insurance, but you will need to ask so you aren't surprised when you get to your appointment.  2) Contact Your Local Health Department Not all health departments have doctors that can see patients for sick visits, but many do, so it is worth a call to see if yours does. If you don't know where your local health department is, you can check in your phone book. The CDC also has a tool to help you locate your state's health department, and many state websites also have listings of all of their local health departments.  3) Find a [facility] If your illness is not likely to be very severe or complicated, you may want to try a walk in clinic. These are popping up all over the country in pharmacies, drugstores, and shopping centers. They're usually staffed by nurse practitioners or physician assistants that have been trained to treat common illnesses and complaints. They're usually fairly quick and inexpensive. However, if you have serious medical issues or chronic medical problems, these are probably not your best option.  No Primary Care Doctor: - Call Health Connect at  [phone] - they can help you locate a primary care doctor that  accepts your insurance, provides certain services,  etc. - Physician Referral Service- [phone]  Chronic Pain Problems: Organization         Address  Phone   Notes  [facility]  [phone] Patients need to be referred by their primary care doctor.   Medication Assistance: Organization         Address  Phone   Notes  [facility] Medication [facility] [address] [place], [place] [address] [phone] --Must be a resident of [facility] -- Must have NO insurance coverage whatsoever (no Medicaid/ Medicare, etc.) -- The pt. MUST have a primary care doctor that directs their care regularly and follows them in the community   MedAssist  [phone]   [employer]  [phone]    Agencies that provide inexpensive medical care: Organization         Address  Phone   Notes  [place]  [phone]   [facility]    [phone]   [facility] [place], [place] [address] [phone]   [place] [address] [place] [phone]   Planned Parenthood    [phone]   [facility]    [phone]   [place] and [place] [address], [place] Phone:  [phone], Fax:  [phone] Hours of Operation:  9 am - 6 pm, M-F.  Also  accepts Medicaid/Medicare and self-pay.  [facility] for [place], [address], [place] Phone: [phone], Fax: [phone]. Hours of Operation:  8:30 am - 5:30 pm, M-F.  Also accepts Medicaid and self-pay.  [facility] [place] [address] [place] Phone: [phone]   [place], [place], [place] [phone], Ext. [phone] Mondays & Thursdays: 7-9 AM.  First 15 patients are seen on a first come, first serve basis.    [place] Providers:  Organization         Address  Phone   Notes  [facility] [address], [place] [phone] Also accepts self-pay patients.  [facility] [address], [place]  [phone]   [place], [address], [place] [phone]   [facility] [address] [place] [phone]   Lucianne Lei [address], [place]   [phone] Only accepts [place] Access [place] patients after they have their name applied to their card.   Self-Pay (no insurance) in [facility]:  Organization         Address  Phone   Notes  Sickle Cell Patients, [facility] [place] [phone]   [facility] [place] [phone]   [facility] [place]  [place], [place], [place] [phone]   [facility]/Dr. Osei-Bonsu  [address], [place] or [address], [place] [phone] Phone number for both [place] and [place] locations is the same.  Urgent Medical and [facility] [address], [place] [phone]   [facility] [address] [place] or [address] [phone]   [facility] [address], [place] [phone], phone; [phone], fax Sees patients 1st and 3rd Saturday of every month.  Must not qualify for public or private insurance (i.e. Medicaid, Medicare, [place] Health Choice, Veterans' Benefits)  Household income should be no more than 200% of the poverty level The clinic cannot treat you if you are pregnant or think you are pregnant  Sexually transmitted diseases are not treated at the clinic.    Dental Care: Organization         Address  Phone  Notes  [facility] Department of [facility] [place] [phone] Accepts children up to  age 73 who are enrolled in [place] or [place]; pregnant women with a Medicaid card; and children who have applied for Medicaid or [place] Health Choice, but were declined, whose parents can pay a reduced fee at time of service.  [facility] Department of [facility]  [address], [place] [phone] Accepts children up to age 74 who are enrolled in [place] or [place]; pregnant women with a Medicaid card; and children who have applied for Medicaid or  Health Choice, but were declined, whose parents can pay a reduced fee at time of service.  [facility] Adult [facility] Access PROGRAM  [place] [phone] Patients are seen by appointment only. Walk-ins are not accepted. [place] will see patients 74 years of age and older. Monday - Tuesday (8am-5pm)  Most Wednesdays (8:30-5pm) $30 per visit, cash only  [facility] Adult [employer] PROGRAM  [address], [facility] [phone] Patients are seen by appointment only. Walk-ins are not accepted. [place] will see patients 23 years of age and older. One Wednesday Evening (Monthly: Volunteer Based).  $30 per visit, cash only  [place]  [phone] for adults; Children under age 57, call Graduate Pediatric Dentistry at [phone]. Children aged 71-14, please call [phone] to request a pediatric application.  Dental services are provided in all areas of dental care including fillings, crowns and bridges, complete and partial dentures, implants, gum treatment, root canals, and extractions. Preventive care is also provided. Treatment is provided to both adults and children. Patients are selected via a lottery and there is often a waiting list.   [facility] [address] [place]  [phone] [email or web address]   [facility] [address] [place], [place] [phone], Ext. [phone] Second and Fourth Thursday of  each month, opens at 6:30 AM; Clinic ends at 9 AM.  Patients are seen on a first-come first-served basis, and a limited number are seen during each clinic.   [facility]  [address] Hillard Danker [place], [place] [phone]   Eligibility Requirements You must have lived in [place], [place], or [place] counties for at least the last three months.   You cannot be eligible for state or federal sponsored [employer], including [employer], [place], or [employer].   You generally cannot be eligible for healthcare insurance through your employer.    How to apply: Eligibility screenings are held every Tuesday and Wednesday afternoon from 1:00 pm until 4:00 pm. You do not need an appointment for the interview!  [facility] [address] [place], [place]   [place]  [place] Department  [place]  [phone]    Behavioral Health Resources in the Community: Intensive Outpatient Programs Organization         Address  Phone  Notes  [place]. [address], [place], [place] [phone]   [facility] [address], [place], [place]   ADS: Alcohol & Drug Svcs [address] [place], [place]   [place] [address],  [place], [place] or [phone]   Substance Abuse Resources Organization         Address  Phone  Notes  Alcohol and Drug Services  [phone]   [place]  [phone]   The [place]   Chinita Pester  [phone]   Residential & Outpatient Substance Abuse Program  [phone]   Psychological Services Organization         Address  Phone  Notes  [facility]  [place]  [phone]   [place] [address],  [place] or [phone]    Mobile Crisis Teams Organization         Address  Phone  Notes  [facility], Mobile Crisis Care Unit  [phone]   [facility]ive Psychotherapeutic Services  [address] [place], [place]   [place] [address] [place] [phone]    Self-Help/Support Groups Organization         Address  Phone  Notes  [facility]ental Health Assoc. of [place] - variety of support groups  [place] Call for more information  Narcotics Anonymous (NA), [facility] [address], [employer] [place]  2 meetings at this location   Special educational needs teacher         Address  Phone  Notes  ASAP Residential Treatment [place],    [place]  [phone]   [facility]  [address], [place] [phone], [place], [place]   [place], [place] [phone] Admissions: 8am-3pm M-F  Incentives Substance [place] [address]    [place], [place] [phone]   [facility] [address] [place], [place], [place]   [facility] [address]  [place], [place]   [facility] Programs - Intensive Outpatient [address] Kristeen [address], [place], [place]   [facility] ([place].,  [place], [place] [phone] or [phone]   [facility]idential Treatment Services (RTS) [address], [place], [place] Accepts Medicaid  [facility] [place] [address]  [place] [phone] Substance Abuse/Addiction Treatment   [facility] Organization         Address  Phone  Notes  [facility]  [phone]   Domenic Schwab, PhD [address] Arlis [place], [place]   [phone] or [phone]   [place], [place] [phone]     [facility] [address], [place], [place] [phone] Insurance/Medicaid/sponsorship through [facility] and Families [address], [address]                                    [place], [place] [phone] [place] [address], [place] [phone]    Dr. Adele Schilder  [phone]   [facility] Dept. 1) [address], [place] 2) [place] 3)  [place] [address], [place] [phone]  [phone]   [place] [phone] or [phone] (After Hours)       Flank Pain Flank pain refers to pain that is located on the side of the body between the upper abdomen and the back. The pain may occur over a short period of time (acute) or may be long-term or reoccurring (chronic). It may be mild or severe. Flank pain can be caused by many things. CAUSES  Some of the more common causes of flank pain include:  Muscle strains.   Muscle spasms.   A disease of your spine (vertebral disk disease).   A lung infection (pneumonia).   Fluid around your lungs (pulmonary edema).   A kidney infection.   Kidney stones.   A very painful skin rash caused by the chickenpox virus (shingles).   Gallbladder disease.  [place] care will depend on the cause of your pain. In general,  Rest as directed by your caregiver.  Drink enough fluids to keep your urine clear or pale yellow.  Only take over-the-counter or prescription medicines as directed by your caregiver. Some medicines may help relieve the pain.  Tell your caregiver about any changes in your pain.  Follow up  with your caregiver as directed. SEEK IMMEDIATE MEDICAL CARE IF:   Your pain is not controlled with medicine.   You have new or worsening symptoms.  Your pain increases.   You have abdominal pain.   You have shortness  of breath.   You have persistent nausea or vomiting.   You have swelling in your abdomen.   You feel faint or pass out.   You have blood in your urine.  You have a fever or persistent symptoms for more than 2-3 days.  You have a fever and your symptoms suddenly get worse. MAKE SURE YOU:   Understand these instructions.  Will watch your condition.  Will get help right away if you are not doing well or get worse.   This information is not intended to replace advice given to you by your health care provider. Make sure you discuss any questions you have with your health care provider.   Document Released: 10/26/2005 Document Revised: 05/29/2012 Document Reviewed: 04/18/2012 Elsevier Interactive Patient Education [employer].

## 2015-07-03 NOTE — ED Provider Notes (Signed)
CSN: [id number]     Arrival date & time 07/03/15  1610 History   First MD Initiated Contact with Patient 07/03/15 1621     Chief Complaint  Patient presents with  . Flank Pain  . Back Pain   HPI  MS. Cullinan is a 49 year old woman with PMHx of asthma, headache and arthritis presenting for left sided flank pain. Pt states the flank pain has been present for 2-3 weeks but is progressively getting worse. The pain is intermittent. Pain is described as burning and "grabbing" and she makes a clenched fist to help visualize. The pain radiates into her left lumbar back. She has not tried any pain relievers at home. She states that movement and breathing exacerbates the pain. She states that she has might not be peeing as much lately. She states that she goes approximately 4 times a day. Denies dysuria, urgency, retention or hematuria. Pt has been seen by ED and urgent care for same complaint of left flank pain with negative work ups in the past.  She is also complaining of bilateral feet pain. She states that when she comes home from work she has burning sensation on the soles of her feet. She denies calf pain, loss of sensation, numbness, color changes or wounds of the feet. Denies fevers, chills, chest pain, SOB, abdominal pain, nausea, vomiting, numbness of the legs, loss of bowel/bladder control, dizziness or syncope.  Past Medical History  Diagnosis Date  . Asthma   . Headache(784.0)   . Arthritis     rt shoulder  . Shortness of breath     on exertion  . Dysrhythmia     sometimes on exertion   Past Surgical History  Procedure Laterality Date  . Cesarean section  1995  . Robotic assisted total hysterectomy N/A 05/15/2013    Procedure: ATTEMPTED ROBOTIC TOTAL HYSTERECTOMY;  Surgeon: Lavonia Drafts, MD;  Location: [facility];  Service: Gynecology;  Laterality: N/A;  . Abdominal hysterectomy Bilateral 05/15/2013    Procedure: HYSTERECTOMY ABDOMINAL WITH BILATERAL SALPINGECTOMY;  Surgeon:  Lavonia Drafts, MD;  Location: [facility];  Service: Gynecology;  Laterality: Bilateral;   No family history on file. Social History  Substance Use Topics  . Smoking status: Never Smoker   . Smokeless tobacco: Never Used  . Alcohol Use: No   OB History    Gravida Para Term Preterm AB TAB SAB Ectopic Multiple Living   11 5 1 4 6  6   5      Review of Systems  Constitutional: Negative for fever and chills.  Respiratory: Negative for shortness of breath.   Cardiovascular: Negative for chest pain.  Gastrointestinal: Negative for nausea, vomiting, abdominal pain and diarrhea.  Genitourinary: Positive for flank pain and decreased urine volume. Negative for dysuria, urgency, frequency and hematuria.  Musculoskeletal: Positive for back pain. Negative for myalgias, arthralgias and neck pain.  Skin: Negative for rash.  Neurological: Negative for dizziness, syncope, weakness, numbness and headaches.  All other systems reviewed and are negative.     Allergies  Review of patient's allergies indicates no known allergies.  Home Medications   Prior to Admission medications   Medication Sig Start Date End Date Taking? Authorizing Provider  albuterol (PROVENTIL HFA;VENTOLIN HFA) 108 (90 BASE) MCG/ACT inhaler Inhale 1-2 puffs into the lungs every 6 (six) hours as needed for wheezing. 12/15/14   Melony Overly, MD  cyclobenzaprine (FLEXERIL) 10 MG tablet Take 1 tablet (10 mg total) by mouth 2 (two) times daily  as needed for muscle spasms. 07/03/15   Raquell Richer, PA-C  meclizine (ANTIVERT) 25 MG tablet Take 1 tablet (25 mg total) by mouth 3 (three) times daily as needed for dizziness. 04/18/15   [name], MD  meloxicam (MOBIC) 7.5 MG tablet Take 1 tablet (7.5 mg total) by mouth daily. Patient not taking: Reported on 04/18/2015 12/15/14   Melony Overly, MD  naproxen (NAPROSYN) 500 MG tablet Take 1 tablet (500 mg total) by mouth 2 (two) times daily. 07/03/15   Dorthia Tout, PA-C  ondansetron  (ZOFRAN ODT) 8 MG disintegrating tablet Take 1 tablet (8 mg total) by mouth every 8 (eight) hours as needed for nausea or vomiting. 04/18/15   [name], MD  predniSONE (DELTASONE) 50 MG tablet Take 1 pill daily for 5 days. Patient not taking: Reported on 04/18/2015 12/15/14   Melony Overly, MD   BP 145/78 mmHg  Pulse 67  Temp(Src) 98.3 F (36.8 C) (Oral)  Resp 16  SpO2 97%  LMP 07/23/2012 Physical Exam  Constitutional: She appears well-developed and well-nourished. No distress.  HENT:  Head: Normocephalic and atraumatic.  Eyes: Conjunctivae are normal. Right eye exhibits no discharge. Left eye exhibits no discharge. No scleral icterus.  Neck: Normal range of motion.  Cardiovascular: Normal rate, regular rhythm, normal heart sounds and intact distal pulses.   Pedal pulses palpable b/l. No peripheral edema  Pulmonary/Chest: Effort normal and breath sounds normal. No respiratory distress. She has no wheezes. She has no rales.  Abdominal: Soft. Bowel sounds are normal. She exhibits no distension. There is no tenderness. There is CVA tenderness (left). There is no rebound and no guarding.  Obese  Musculoskeletal: Normal range of motion.  Tender to deep palpation of left flank and left lumbar region. No point tenderness over spinous processes. FROM of lumbar region. Moves all extremities spontaneously. Walks with a steady gait  Neurological: She is alert. Coordination normal.  5/5 ankle strength. Sensation to light touch intact throughout.   Skin: Skin is warm and dry.     No rashes or color changes over flank or back. No color changes or wounds over feet  Psychiatric: She has a normal mood and affect. Her behavior is normal.  Nursing note and vitals reviewed.   ED Course  Procedures (including critical care time) Labs Review Labs Reviewed  URINALYSIS, ROUTINE W REFLEX MICROSCOPIC (NOT AT [facility]) - Abnormal; Notable for the following:    APPearance CLOUDY (*)    Hgb urine dipstick  SMALL (*)    All other components within normal limits  COMPREHENSIVE METABOLIC PANEL - Abnormal; Notable for the following:    Glucose, Bld 120 (*)    BUN <5 (*)    All other components within normal limits  URINE MICROSCOPIC-ADD ON - Abnormal; Notable for the following:    Squamous Epithelial / LPF FEW (*)    Bacteria, UA FEW (*)    All other components within normal limits  CBC WITH DIFFERENTIAL/PLATELET    Imaging Review Ct Renal Stone Study  07/03/2015  CLINICAL DATA:  Patient presents to the emergency department with left flank pain and left lower back pain for several weeks progressively worsening. Some decreased urination but no dysuria. EXAM: CT ABDOMEN AND PELVIS WITHOUT CONTRAST TECHNIQUE: Multidetector CT imaging of the abdomen and pelvis was performed following the standard protocol without IV contrast. COMPARISON:  06/02/2014 FINDINGS: Lung bases: Minor reticular opacities consistent with subsegmental atelectasis, scarring or a combination. Heart mildly enlarged. Liver, spleen, gallbladder,  pancreas, adrenal glands:  Unremarkable. No renal masses or stones. No hydronephrosis. Ureters are normal in course and in caliber. Bladder is unremarkable. Uterus and adnexa: Status post hysterectomy. No pelvic/adnexal masses. Lymph nodes:  No adenopathy. Ascites:  None. Gastrointestinal:  Unremarkable.  Normal appendix visualized. Abdominal wall: Small fat containing upper abdominal midline hernia, stable. Musculoskeletal:  No osteoblastic or osteolytic lesions. IMPRESSION: 1. No acute findings. No evidence of renal or ureteral stones or obstructive uropathy. No findings to explain flank pain. Electronically Signed   By: Lajean Manes M.D.   On: 07/03/2015 19:05   I have personally reviewed and evaluated these images and lab results as part of my medical decision-making.   EKG Interpretation None      MDM   Final diagnoses:  Flank pain   Pt presenting with left flank and back pain. Pt  has been seen by ED and UC in past for same complaints. She does not have PCP follow up currently. Pt also complaining of burning of sole of feet after work. Tenderness to deep palpation of left flank and left lumbar region. No neurological deficits on exam. Pt able to ambulate. No concern for cauda equina. Pt's legs neurovascularly intact. Labwork reassuring. Small Hgb on urine. CT renal stone negative for acute findings. Pain likely musculoskeletal. Will try trial of naproxen and flexeril. Pt encouraged to establish primary care for further evaluation and treatment of back pain. Given information for [facility] and resource guide.  At this time there does not appear to be any evidence of an acute emergency medical condition and the patient appears stable for discharge with appropriate outpatient follow up. Diagnosis was discussed with patient who verbalizes understanding and is agreeable to discharge. Pt case discussed with Dr. Betsey Holiday who agrees with my plan. Return precautions given in discharge paperwork and discussed with pt at bedside. Pt stable for discharge          Josephina Gip, PA-C 07/03/15 2013  Netta Fodge, PA-C 07/03/15 2014  Orpah Greek, MD 07/03/15 [phone]

## 2015-09-30 ENCOUNTER — Emergency Department (HOSPITAL_COMMUNITY): Payer: Medicaid Other

## 2015-09-30 ENCOUNTER — Emergency Department (HOSPITAL_COMMUNITY)
Admission: EM | Admit: 2015-09-30 | Discharge: 2015-10-01 | Disposition: A | Discharge status: Home / Self Care | Payer: Medicaid Other | Attending: Emergency Medicine | Admitting: Emergency Medicine

## 2015-09-30 ENCOUNTER — Encounter (HOSPITAL_COMMUNITY): Payer: Self-pay | Admitting: Nurse Practitioner

## 2015-09-30 DIAGNOSIS — J45909 Unspecified asthma, uncomplicated: Secondary | ICD-10-CM | POA: Insufficient documentation

## 2015-09-30 DIAGNOSIS — R109 Unspecified abdominal pain: Secondary | ICD-10-CM | POA: Insufficient documentation

## 2015-09-30 DIAGNOSIS — G8929 Other chronic pain: Secondary | ICD-10-CM

## 2015-09-30 DIAGNOSIS — E669 Obesity, unspecified: Secondary | ICD-10-CM | POA: Insufficient documentation

## 2015-09-30 DIAGNOSIS — M545 Low back pain: Secondary | ICD-10-CM | POA: Insufficient documentation

## 2015-09-30 LAB — CBC
HEMATOCRIT: 35.8 % — AB (ref 36.0–46.0)
HEMOGLOBIN: 11.4 g/dL — AB (ref 12.0–15.0)
MCH: 25.8 pg — ABNORMAL LOW (ref 26.0–34.0)
MCHC: 31.8 g/dL (ref 30.0–36.0)
MCV: 81 fL (ref 78.0–100.0)
Platelets: 191 10*3/uL (ref 150–400)
RBC: 4.42 MIL/uL (ref 3.87–5.11)
RDW: 13.8 % (ref 11.5–15.5)
WBC: 4.9 10*3/uL (ref 4.0–10.5)

## 2015-09-30 LAB — URINALYSIS, ROUTINE W REFLEX MICROSCOPIC
Bilirubin Urine: NEGATIVE
Glucose, UA: NEGATIVE mg/dL
Hgb urine dipstick: NEGATIVE
Ketones, ur: NEGATIVE mg/dL
Leukocytes, UA: NEGATIVE
NITRITE: NEGATIVE
PH: 6.5 (ref 5.0–8.0)
Protein, ur: NEGATIVE mg/dL
SPECIFIC GRAVITY, URINE: 1.014 (ref 1.005–1.030)

## 2015-09-30 LAB — I-STAT TROPONIN, ED: Troponin i, poc: 0 ng/mL (ref 0.00–0.08)

## 2015-09-30 LAB — BASIC METABOLIC PANEL
ANION GAP: 9 (ref 5–15)
BUN: 12 mg/dL (ref 6–20)
CHLORIDE: 106 mmol/L (ref 101–111)
CO2: 28 mmol/L (ref 22–32)
CREATININE: 0.89 mg/dL (ref 0.44–1.00)
Calcium: 9 mg/dL (ref 8.9–10.3)
GFR calc non Af Amer: >60 mL/min (ref >60)
GLUCOSE: 122 mg/dL — AB (ref 65–99)
Potassium: 3.7 mmol/L (ref 3.5–5.1)
Sodium: 143 mmol/L (ref 135–145)

## 2015-09-30 MED ORDER — OXYCODONE-ACETAMINOPHEN 5-325 MG PO TABS
1.0000 | ORAL_TABLET | Freq: Once | ORAL | Status: AC
  Administered 2015-09-30: 1 via ORAL
  Filled 2015-09-30: qty 1

## 2015-09-30 NOTE — ED Notes (Addendum)
She c/o pain in her feet, knees, back , sides, some cp, and headaches. States she has been here for same complaint prior with no diagnosis. She reports this has been going on for months. Describes the pain as "hot needles poking her." she has some intermittent SOB. She denies cough, fevers, nausea/vomiting. She is A&Ox4, resp e/u

## 2015-09-30 NOTE — ED Provider Notes (Signed)
CSN: [id number]     Arrival date & time 09/30/15  1636 History   First MD Initiated Contact with Patient 09/30/15 2217     Chief Complaint  Patient presents with  . Generalized Body Aches     (Consider location/radiation/quality/duration/timing/severity/associated sxs/prior Treatment) HPI   [name] is a 50 y.o. female who presents with recurrent left sided pain, and her back, for 2 days. Similar pain previously multiple times. She states that she has been evaluated for it previously with no diagnosis. The pain feels like "hot needles". She denies fever, cough, vomiting, diarrhea, dysuria or urinary frequency. She has not tried anything for the pain. There are no other known modifying factors.   Past Medical History  Diagnosis Date  . Asthma   . Headache(784.0)   . Arthritis     rt shoulder  . Shortness of breath     on exertion  . Dysrhythmia     sometimes on exertion   Past Surgical History  Procedure Laterality Date  . Cesarean section  1995  . Robotic assisted total hysterectomy N/A 05/15/2013    Procedure: ATTEMPTED ROBOTIC TOTAL HYSTERECTOMY;  Surgeon: Lavonia Drafts, MD;  Location: [facility];  Service: Gynecology;  Laterality: N/A;  . Abdominal hysterectomy Bilateral 05/15/2013    Procedure: HYSTERECTOMY ABDOMINAL WITH BILATERAL SALPINGECTOMY;  Surgeon: Lavonia Drafts, MD;  Location: [facility];  Service: Gynecology;  Laterality: Bilateral;   History reviewed. No pertinent family history. Social History  Substance Use Topics  . Smoking status: Never Smoker   . Smokeless tobacco: Never Used  . Alcohol Use: No   OB History    Gravida Para Term Preterm AB TAB SAB Ectopic Multiple Living   11 5 1 4 6  6   5      Review of Systems  All other systems reviewed and are negative.     Allergies  Review of patient's allergies indicates no known allergies.  Home Medications   Prior to Admission medications   Medication Sig Start Date End Date  Taking? Authorizing Provider  albuterol (PROVENTIL HFA;VENTOLIN HFA) 108 (90 BASE) MCG/ACT inhaler Inhale 1-2 puffs into the lungs every 6 (six) hours as needed for wheezing. 12/15/14  Yes Melony Overly, MD  cyclobenzaprine (FLEXERIL) 10 MG tablet Take 1 tablet (10 mg total) by mouth 2 (two) times daily as needed for muscle spasms. Patient not taking: Reported on 09/30/2015 07/03/15   Lahoma Crocker Barrett, PA-C  meclizine (ANTIVERT) 25 MG tablet Take 1 tablet (25 mg total) by mouth 3 (three) times daily as needed for dizziness. Patient not taking: Reported on 09/30/2015 04/18/15   [name], MD  meloxicam (MOBIC) 7.5 MG tablet Take 1 tablet (7.5 mg total) by mouth daily. Patient not taking: Reported on 04/18/2015 12/15/14   Melony Overly, MD  naproxen (NAPROSYN) 500 MG tablet Take 1 tablet (500 mg total) by mouth 2 (two) times daily. Patient not taking: Reported on 09/30/2015 07/03/15   Stevi Barrett, PA-C  ondansetron (ZOFRAN ODT) 8 MG disintegrating tablet Take 1 tablet (8 mg total) by mouth every 8 (eight) hours as needed for nausea or vomiting. Patient not taking: Reported on 09/30/2015 04/18/15   [name], MD  predniSONE (DELTASONE) 50 MG tablet Take 1 pill daily for 5 days. Patient not taking: Reported on 04/18/2015 12/15/14   Melony Overly, MD   BP 135/69 mmHg  Pulse 67  Temp(Src) 98.2 F (36.8 C) (Oral)  Resp 20  SpO2 100%  LMP 07/23/2012 Physical Exam  Constitutional: She is oriented to person, place, and time. She appears well-developed.  She is obese  HENT:  Head: Normocephalic and atraumatic.  Right Ear: External ear normal.  Left Ear: External ear normal.  Eyes: Conjunctivae and EOM are normal. Pupils are equal, round, and reactive to light.  Neck: Normal range of motion and phonation normal. Neck supple.  Cardiovascular: Normal rate, regular rhythm and normal heart sounds.   Pulmonary/Chest: Effort normal and breath sounds normal. She exhibits no bony tenderness.  Abdominal: Soft.  There is no tenderness.  Musculoskeletal: Normal range of motion.  Tender left flank and left lower back, but normal range of motion of the lumbar spine.  Neurological: She is alert and oriented to person, place, and time. No cranial nerve deficit or sensory deficit. She exhibits normal muscle tone. Coordination normal.  Skin: Skin is warm, dry and intact.  Psychiatric: She has a normal mood and affect. Her behavior is normal. Judgment and thought content normal.  Nursing note and vitals reviewed.   ED Course  Procedures (including critical care time) Medications  oxyCODONE-acetaminophen (PERCOCET/ROXICET) 5-325 MG per tablet 1 tablet (1 tablet Oral Given 09/30/15 2348)    Patient Vitals for the past 24 hrs:  BP Temp Temp src Pulse Resp SpO2  09/30/15 2330 135/79 mmHg - - 64 13 99 %  09/30/15 2230 126/71 mmHg - - 65 12 99 %  09/30/15 2200 103/89 mmHg - - 61 13 98 %  09/30/15 2130 123/67 mmHg - - 66 23 100 %  09/30/15 2105 135/69 mmHg 98.2 F (36.8 C) Oral 67 20 100 %  09/30/15 1715 143/87 mmHg 97.9 F (36.6 C) Oral 80 16 98 %    00:10 Reevaluation with update and discussion. After initial assessment and treatment, an updated evaluation reveals no further complaints. Findings discussed with patient and daughter, all questions were answered. Deboraha Goar L    Labs Review Labs Reviewed  BASIC METABOLIC PANEL - Abnormal; Notable for the following:    Glucose, Bld 122 (*)    All other components within normal limits  CBC - Abnormal; Notable for the following:    Hemoglobin 11.4 (*)    HCT 35.8 (*)    MCH 25.8 (*)    All other components within normal limits  I-STAT TROPOININ, ED    Imaging Review Dg Chest 2 View  09/30/2015  CLINICAL DATA:  Left-sided chest pain and intermittent shortness of breath for 9 months. EXAM: CHEST  2 VIEW COMPARISON:  PA and lateral chest 12/05/2012. FINDINGS: The lungs are clear. Heart size is upper normal. No pneumothorax or pleural effusion. Mild  tracheal deviation to the right is unchanged. No focal bony abnormality. IMPRESSION: No acute disease. Electronically Signed   By: Inge Rise M.D.   On: 09/30/2015 18:11   I have personally reviewed and evaluated these images and lab results as part of my medical decision-making.   EKG Interpretation None      MDM   Final diagnoses:  Chronic flank pain    Nonspecific recurrent flank pain, likely muscular in origin. Doubt radiculopathy, spinal myelopathy, UTI or metabolic instability.   Nursing Notes Reviewed/ Care Coordinated Applicable Imaging Reviewed Interpretation of Laboratory Data incorporated into ED treatment  The patient appears reasonably screened and/or stabilized for discharge and I doubt any other medical condition or other [facility] requiring further screening, evaluation, or treatment in the ED at this time prior to discharge.  Plan: Home Medications- Percocet, IBU; Home Treatments- rest, heat; return  here if the recommended treatment, does not improve the symptoms; Recommended follow up- PCP 1-2 weeks   Daleen Bo, MD 10/01/15 [phone]

## 2015-10-01 MED ORDER — OXYCODONE-ACETAMINOPHEN 5-325 MG PO TABS
1.0000 | ORAL_TABLET | ORAL | Status: DC | PRN
Start: 2015-10-01 — End: 2016-02-20

## 2015-10-01 NOTE — Discharge Instructions (Signed)
Use heat on the sore area 3 or 4 times a day. Take ibuprofen 400 mg 3 times a day with meals for pain. Use the stronger pain medicine, if needed, but do not drive or drink alcohol when using it.    Flank Pain Flank pain refers to pain that is located on the side of the body between the upper abdomen and the back. The pain may occur over a short period of time (acute) or may be long-term or reoccurring (chronic). It may be mild or severe. Flank pain can be caused by many things. CAUSES  Some of the more common causes of flank pain include:  Muscle strains.   Muscle spasms.   A disease of your spine (vertebral disk disease).   A lung infection (pneumonia).   Fluid around your lungs (pulmonary edema).   A kidney infection.   Kidney stones.   A very painful skin rash caused by the chickenpox virus (shingles).   Gallbladder disease.  [place] care will depend on the cause of your pain. In general,  Rest as directed by your caregiver.  Drink enough fluids to keep your urine clear or pale yellow.  Only take over-the-counter or prescription medicines as directed by your caregiver. Some medicines may help relieve the pain.  Tell your caregiver about any changes in your pain.  Follow up with your caregiver as directed. SEEK IMMEDIATE MEDICAL CARE IF:   Your pain is not controlled with medicine.   You have new or worsening symptoms.  Your pain increases.   You have abdominal pain.   You have shortness of breath.   You have persistent nausea or vomiting.   You have swelling in your abdomen.   You feel faint or pass out.   You have blood in your urine.  You have a fever or persistent symptoms for more than 2-3 days.  You have a fever and your symptoms suddenly get worse. MAKE SURE YOU:   Understand these instructions.  Will watch your condition.  Will get help right away if you are not doing well or get worse.   This  information is not intended to replace advice given to you by your health care provider. Make sure you discuss any questions you have with your health care provider.   Document Released: 10/26/2005 Document Revised: 05/29/2012 Document Reviewed: 04/18/2012 Elsevier Interactive Patient Education 2016 [employer].  Emergency Department Resource Guide 1) Find a Doctor and Pay Out of Pocket Although you won't have to find out who is covered by your insurance plan, it is a good idea to ask around and get recommendations. You will then need to call the office and see if the doctor you have chosen will accept you as a new patient and what types of options they offer for patients who are self-pay. Some doctors offer discounts or will set up payment plans for their patients who do not have insurance, but you will need to ask so you aren't surprised when you get to your appointment.  2) Contact Your Local Health Department Not all health departments have doctors that can see patients for sick visits, but many do, so it is worth a call to see if yours does. If you don't know where your local health department is, you can check in your phone book. The CDC also has a tool to help you locate your state's health department, and many state websites also have listings of all of their local health  departments.  3) Find a [facility] If your illness is not likely to be very severe or complicated, you may want to try a walk in clinic. These are popping up all over the country in pharmacies, drugstores, and shopping centers. They're usually staffed by nurse practitioners or physician assistants that have been trained to treat common illnesses and complaints. They're usually fairly quick and inexpensive. However, if you have serious medical issues or chronic medical problems, these are probably not your best option.  No Primary Care Doctor: - Call Health Connect at  [phone] - they can help you locate a primary care  doctor that  accepts your insurance, provides certain services, etc. - Physician Referral Service- [phone]  Chronic Pain Problems: Organization         Address  Phone   Notes  [facility]  [phone] Patients need to be referred by their primary care doctor.   Medication Assistance: Organization         Address  Phone   Notes  [facility] Medication [facility] [address] [place], [place] [address] [phone] --Must be a resident of [facility] -- Must have NO insurance coverage whatsoever (no Medicaid/ Medicare, etc.) -- The pt. MUST have a primary care doctor that directs their care regularly and follows them in the community   MedAssist  [phone]   [employer]  [phone]    Agencies that provide inexpensive medical care: Organization         Address  Phone   Notes  [place]  [phone]   [facility]    [phone]   [facility] [place], [place] [address] [phone]   [place] [address], [place] [phone]   Planned Parenthood    [phone]   [facility]    [phone]   [place] and [place] [address], [place] Phone:  [phone], Fax:  [phone] Hours of Operation:  9 am - 6 pm, M-F.  Also accepts Medicaid/Medicare and self-pay.  [facility] for [place], [address], [place] Phone: [phone], Fax: [phone]. Hours of Operation:  8:30 am - 5:30 pm, M-F.  Also accepts Medicaid and self-pay.  [facility] Services [place] [address], [place] Phone: [phone]   [place], [place], [place] [phone], Ext. [phone] Mondays & Thursdays: 7-9 AM.  First 15 patients are seen on a first come, first serve basis.    [place]  Providers:  Organization         Address  Phone   Notes  [facility] [address],  [phone] Also accepts self-pay patients.  [facility] [id number] [place], [place]  [phone]   [place], [address], [place] [phone]   [facility] [address] [place] [phone]   Lucianne Lei [address], [place]   [phone] Only accepts [place] Access [place] patients after they have their name applied to their card.   Self-Pay (no insurance) in [facility]:  Organization         Address  Phone   Notes  Sickle Cell Patients, [place] [facility]  [place] [phone]   [facility] [place] [phone]   [facility] [place]  [address], [address], [place] [phone]   [facility]/Dr. Osei-Bonsu  [address] [place] or [address], [place] [phone] Phone number for both [place] and [place] locations is the same.  Urgent Medical and [facility] [address] [place] [phone]   [facility] [address] [place] or [address] [phone]   [facility] [address], [place] [phone], phone; [phone], fax Sees patients 1st and 3rd Saturday of every month.  Must not qualify for public or private insurance (i.e. Medicaid, Medicare, [place] Health Choice, Veterans' Benefits)  Household income should be no more than 200% of the poverty level The clinic cannot treat you if you are pregnant or think you are pregnant  Sexually transmitted diseases are not treated at the clinic.    Dental Care: Organization         Address  Phone  Notes  [facility] Department of [facility] [place] [phone] Accepts children up to age 41 who are enrolled in [place] or [place]; pregnant women with a Medicaid card; and children who have applied for Medicaid or [place] Health Choice, but were declined, whose parents can pay a reduced fee at time of service.  [facility] Department of [facility]  [address], [place] [phone] Accepts children up to age 16 who are enrolled in [place] or [place]; pregnant women with a Medicaid card; and children who have applied for Medicaid or [place] Health Choice, but were declined, whose parents can pay a reduced fee at time of service.  [facility] Adult Dental Access PROGRAM  [place] [phone] Patients are seen by appointment only. Walk-ins are not accepted. [place] will see patients 79 years of age and older. Monday - Tuesday (8am-5pm) Most Wednesdays (8:30-5pm) $30 per visit, cash only  [facility] Adult Dental Access PROGRAM  [address], [facility] [phone] Patients are seen by appointment only. Walk-ins are not accepted. [place] will see patients 7 years of age and older. One Wednesday Evening (Monthly: Volunteer Based).  $30 per visit, cash only  [place]  [phone] for adults; Children under age 22, call Graduate Pediatric Dentistry at [phone]. Children aged 11-14, please call [phone] to request a pediatric application.  Dental services are provided in all areas of dental care including fillings, crowns and bridges, complete and partial dentures, implants, gum treatment, root canals, and extractions. Preventive care is also provided. Treatment is provided to both adults and children. Patients are selected via a lottery and there is often a waiting list.   [facility] [address] [place]  [phone] [email or web address]   [facility]  [address] [place], [place] [phone], Ext. [phone] Second and Fourth Thursday of each month, opens at 6:30 AM; Clinic ends at 9 AM.  Patients are seen on a first-come first-served basis, and a limited number are seen during each clinic.   [facility]  [address] Hillard Danker [place], [place] [phone]   Eligibility Requirements You must have lived  in [place], [place], or [place] counties for at least the last three months.   You cannot be eligible for state or federal sponsored [employer], including [employer], [place], or [employer].   You generally cannot be eligible for healthcare insurance through your employer.    How to apply: Eligibility screenings are held every Tuesday and Wednesday afternoon from 1:00 pm until 4:00 pm. You do not need an appointment for the interview!  [facility] [address] [place], [place]   [place]  [place] Department  [place]  [phone]    Behavioral Health Resources in the Community: Intensive Outpatient Programs Organization         Address  Phone  Notes  [place]. [address] [place], [place] [phone]   [facility] Outpatient [address], [place], [place]   ADS: Alcohol [facility] Drug S[facility] [address] [place], [place]   [place] [address]  [place], [place] or [phone]   Substance Abuse Resources Organization         Address  Phone  Notes  Alcohol and Drug Services  [phone]   [place]  [phone]   [facility]   Chinita Pester  [phone]   Residential & Outpatient Substance Abuse Program  [phone]   Psychological Services Organization         Address  Phone  Notes  [facility] [place]  [place]  [phone]   [place] [address], [place] or [phone]    Mobile Crisis Teams Organization         Address  Phone  Notes  [facility]rapeutic Alternatives, Mobile Crisis Care Unit  [phone]   [facility] Psychotherapeutic Services  [address] [place], [place]   [place] Levels [address], [place] [phone]    Self-Help/Support Groups Organization         Address  Phone             Notes  [address] of [place] - variety of support groups  [place] Call for more information  Narcotics Anonymous (NA), [facility] [address], [employer] [place]  2 meetings at this location   Special educational needs teacher         Address  Phone  Notes  ASAP Residential Treatment [place],    [place]  [phone]   [facility]  [address] [place] [id number], [place], [place]   [place], [place] [phone] Admissions: 8am-3pm M-F  Incentives Substance [place] [address]    [place], [place] [phone]   [facility] [address] [place], [place], [place]   [facility] [address],  [place], [place]   [facility] Programs - Intensive Outpatient [address], [place], [place]   [facility] ([place].,  [place], [place] or [phone]   Residential Treatment Services (RTS) [address], [place], [place] Accepts Medicaid  [facility] [place] [address]  [place] [phone] Substance Abuse/Addiction Treatment   [facility] Resources Organization         Address  Phone  Notes  [facility]  [phone]   Domenic Schwab, PhD [address] [place]lis [place], [place]   [phone] or [phone]    [place], [place] [phone]   [address], [place], [place] [phone] Insurance/Medicaid/sponsorship through [facility] and Families [address] [place]                                    [place], [place] [phone] [place] [address], [place] [phone]    Dr. Adele Schilder  [phone]   [facility] Dept. 1) [address] [place] 2) [place] 3)  [address], [place] [phone]  [phone]   [place] [phone] or [phone] (After Hours)

## 2015-10-02 LAB — URINE CULTURE: SPECIAL REQUESTS: NORMAL

## 2015-10-16 ENCOUNTER — Emergency Department (HOSPITAL_COMMUNITY)
Admission: EM | Admit: 2015-10-16 | Discharge: 2015-10-16 | Disposition: A | Discharge status: Home / Self Care | Payer: Medicaid Other | Attending: Emergency Medicine | Admitting: Emergency Medicine

## 2015-10-16 ENCOUNTER — Encounter (HOSPITAL_COMMUNITY): Payer: Self-pay | Admitting: Emergency Medicine

## 2015-10-16 DIAGNOSIS — Z79899 Other long term (current) drug therapy: Secondary | ICD-10-CM | POA: Insufficient documentation

## 2015-10-16 DIAGNOSIS — Z8679 Personal history of other diseases of the circulatory system: Secondary | ICD-10-CM | POA: Insufficient documentation

## 2015-10-16 DIAGNOSIS — J45909 Unspecified asthma, uncomplicated: Secondary | ICD-10-CM | POA: Insufficient documentation

## 2015-10-16 DIAGNOSIS — B029 Zoster without complications: Secondary | ICD-10-CM

## 2015-10-16 DIAGNOSIS — M1990 Unspecified osteoarthritis, unspecified site: Secondary | ICD-10-CM | POA: Insufficient documentation

## 2015-10-16 DIAGNOSIS — M5432 Sciatica, left side: Secondary | ICD-10-CM

## 2015-10-16 MED ORDER — LIDOCAINE 5 % EX PTCH
1.0000 | MEDICATED_PATCH | CUTANEOUS | Status: DC
  Administered 2015-10-16: 1 via TRANSDERMAL
  Filled 2015-10-16: qty 1

## 2015-10-16 MED ORDER — PREDNISONE 20 MG PO TABS: 40.0000 mg | ORAL_TABLET | Freq: Every day | ORAL | Status: DC

## 2015-10-16 MED ORDER — LIDOCAINE 5 % EX PTCH: 1.0000 | MEDICATED_PATCH | CUTANEOUS | Status: DC

## 2015-10-16 MED ORDER — PREDNISONE 20 MG PO TABS
60.0000 mg | ORAL_TABLET | Freq: Once | ORAL | Status: AC
Start: 2015-10-16 — End: 2015-10-16
  Administered 2015-10-16: 60 mg via ORAL
  Filled 2015-10-16: qty 3

## 2015-10-16 MED ORDER — KETOROLAC TROMETHAMINE 60 MG/2ML IM SOLN
60.0000 mg | Freq: Once | INTRAMUSCULAR | Status: AC
  Administered 2015-10-16: 60 mg via INTRAMUSCULAR
  Filled 2015-10-16: qty 2

## 2015-10-16 MED ORDER — ONDANSETRON 4 MG PO TBDP: 4.0000 mg | ORAL_TABLET | Freq: Once | ORAL | Status: DC

## 2015-10-16 MED ORDER — IBUPROFEN 800 MG PO TABS: 800.0000 mg | ORAL_TABLET | Freq: Three times a day (TID) | ORAL | Status: DC

## 2015-10-16 MED ORDER — ACYCLOVIR 800 MG PO TABS: 800.0000 mg | ORAL_TABLET | Freq: Every day | ORAL | Status: DC

## 2015-10-16 NOTE — Discharge Instructions (Signed)
You have been seen today for back pain and a rash. Follow up with PCP as soon as possible for chronic management of these issues. Return to ED should symptoms worsen. Do not put the Lidoderm patch over the rash.   Emergency Department Resource Guide 1) Find a Doctor and Pay Out of Pocket Although you won't have to find out who is covered by your insurance plan, it is a good idea to ask around and get recommendations. You will then need to call the office and see if the doctor you have chosen will accept you as a new patient and what types of options they offer for patients who are self-pay. Some doctors offer discounts or will set up payment plans for their patients who do not have insurance, but you will need to ask so you aren't surprised when you get to your appointment.  2) Contact Your Local Health Department Not all health departments have doctors that can see patients for sick visits, but many do, so it is worth a call to see if yours does. If you don't know where your local health department is, you can check in your phone book. The CDC also has a tool to help you locate your state's health department, and many state websites also have listings of all of their local health departments.  3) Find a [facility] If your illness is not likely to be very severe or complicated, you may want to try a walk in clinic. These are popping up all over the country in pharmacies, drugstores, and shopping centers. They're usually staffed by nurse practitioners or physician assistants that have been trained to treat common illnesses and complaints. They're usually fairly quick and inexpensive. However, if you have serious medical issues or chronic medical problems, these are probably not your best option.  No Primary Care Doctor: - Call Health Connect at  [phone] - they can help you locate a primary care doctor that  accepts your insurance, provides certain services, etc. - Physician Referral Service-  [phone]  Chronic Pain Problems: Organization         Address  Phone   Notes  [facility]  [phone] Patients need to be referred by their primary care doctor.   Medication Assistance: Organization         Address  Phone   Notes  [facility] Medication [facility] [address] [place], [place] [address] [phone] --Must be a resident of [facility] -- Must have NO insurance coverage whatsoever (no Medicaid/ Medicare, etc.) -- The pt. MUST have a primary care doctor that directs their care regularly and follows them in the community   Med[facility]ist  [phone]   [employer]  [phone]    Agencies that provide inexpensive medical care: Organization         Address  Phone   Notes  [place]  [phone]   [facility]    [phone]   [facility] [place], [place] [address] [phone]   [place] [address], [place] [phone]   Planned Parenthood    [phone]   [facility]    [phone]   [place] and [place] [address],  Phone:  [phone], Fax:  [phone] Hours of Operation:  9 am - 6 pm, M-F.  Also accepts Medicaid/Medicare and  self-pay.  [facility] for [place], [address], [place] Phone: [phone], Fax: [phone]. Hours of Operation:  8:30 am - 5:30 pm, M-F.  Also accepts Medicaid and self-pay.  [facility] [place] [address] [place] Phone: [phone]   [place], [place], [place] [phone], Ext. [phone] Mondays & Thursdays: 7-9 AM.  First 15 patients are seen on a first come, first serve basis.    [place] Providers:  Organization         Address  Phone   Notes  [facility] [address],  [place] [phone] Also accepts self-pay patients.  [facility] [id number] [place], [place]  [phone]   [place], [address], [place] [phone]   [facility] [address], [place] [phone]   Lucianne Lei [address], [place]   [phone] Only accepts [place] Access [place] patients after they have their name applied to their card.   Self-Pay (no insurance) in [facility]:  Organization         Address  Phone   Notes  Sickle Cell Patients, [facility] [place] [phone]   [facility] Urgent Care [place] [phone]   [facility] [place]  [place], [place], [place] [phone]   [facility]/Dr. Osei-Bonsu  [address], [place] or [address], [place] [phone] Phone number for both [place] and [place] locations is the same.  Urgent Medical and [facility] [address] [place] [phone]   [facility] [address], [place] or [address] [phone]   [facility] [address] [place] [phone], phone; [phone], fax Sees patients 1st and 3rd Saturday of every month.  Must not qualify for public or private insurance (i.e. Medicaid, Medicare, [place] Health Choice, Veterans' Benefits)  Household income should be no more than 200% of the poverty level The clinic cannot treat you if you are pregnant or think you are pregnant  Sexually transmitted diseases are not treated at the clinic.    Dental Care: Organization         Address  Phone  Notes  [facility] Department of [facility] [place] [phone] Accepts children up to age 83 who are enrolled in  [place] or [place]; pregnant women with a Medicaid card; and children who have applied for Medicaid or [place] Health Choice, but were declined, whose parents can pay a reduced fee at time of service.  [facility] Department of [facility]  [address], [place] [phone] Accepts children up to age 32 who are enrolled in [place] or [place]; pregnant women with a Medicaid card; and children who have applied for Medicaid or [place] Health Choice, but were declined, whose parents can pay a reduced fee at time of service.  [facility] Access PRO[facility]  [place] [phone] Patients are seen by appointment only. Walk-ins are not accepted. [place] will see patients 28 years of age and older. Monday - Tuesday (8am-5pm) Most Wednesdays (8:30-5pm) $  30 per visit, cash only  [facility] Adult [employer] PROGRAM  [address], [facility] [phone] Patients are seen by appointment only. Walk-ins are not accepted. [place] will see patients 66 years of age and older. One Wednesday Evening (Monthly: Volunteer Based).  $30 per visit, cash only  [place]  [phone] for adults; Children under age 85, call Graduate Pediatric Dentistry at [phone]. Children aged 31-14, please call [phone] to request a pediatric application.  Dental services are provided in all areas of dental care including fillings, crowns and bridges, complete and partial dentures, implants, gum treatment, root canals, and extractions. Preventive care is also provided. Treatment is provided to both adults and children. Patients are selected via a lottery and there is often a waiting list.   [facility] [address]  [phone] [email or web address]   [facility] [address] [place], [place] [phone], Ext. [phone] Second and Fourth Thursday of each month, opens at 6:30  AM; Clinic ends at 9 AM.  Patients are seen on a first-come first-served basis, and a limited number are seen during each clinic.   [facility]  [address] [place], [place] [phone]   Eligibility Requirements You must have lived in [place], [place], or [place] counties for at least the last three months.   You cannot be eligible for state or federal sponsored [employer], including [employer], [place], or [employer].   You generally cannot be eligible for healthcare insurance through your employer.    How to apply: Eligibility screenings are held every Tuesday and Wednesday afternoon from 1:00 pm until 4:00 pm. You do not need an appointment for the interview!  [facility] [address] [place], [place]   [place]  [place] Department  [place]  [phone]    Behavioral Health Resources in the Community: Intensive Outpatient Programs Organization         Address  Phone  Notes  [place]. [address] [place], [place] [phone]   [facility] [address] [place], [place]   ADS: Alcohol & Drug Svcs [address], [place], [place]   [place] [address],  [place], [place] or [phone]   Substance Abuse Resources Organization         Address  Phone  Notes  Alcohol and Drug Services  [phone]   [place]  [phone]   [place]   Chinita Pester  [phone]   Residential & Outpatient Substance Abuse Program  [phone]   Psychological Services Organization         Address  Phone  Notes  [facility] [place]  [place]  [phone]   [place] [address] [place] or  [phone]    Mobile Crisis Teams Organization         Address  Phone  Notes  [facility], Mobile Crisis Care Unit  [phone]   [facility] Psychotherapeutic Services  [address] [place], [place]   [place] Levels [address], [place] [phone]    Self-Help/Support Groups Organization         Address  Phone  Notes  [facility]ental Health Assoc[facility] of [place] - variety of support groups  [place] Call for more information  Narcotics Anonymous (NA), [facility] [address], [employer] [place]  2 meetings at this location   Special educational needs teacher         Address  Phone  Notes  ASAP Residential Treatment [place],    [place]  [phone]   [facility]  [address] [place] [id number], [place], [place]   [place], [place] [phone] Admissions: 8am-3pm M-F  Incentives Substance [place] [address]    [place], [place] [phone]   [facility] [address] [place], [place], [place]   [facility] [address],  [place], [place]   [facility] Programs - Intensive Outpatient [address], [place], [place]   [facility] [address]  [place], [place] [phone] or [phone]   [facility] Services (RTS) [address] [place], [place] Accepts Medicaid  [facility] [place] [address],  [place] [phone] Substance Abuse/Addiction Treatment   [facility] Organization         Address  Phone  Notes  [facility]  [phone]   Domenic Schwab, PhD [address] [place]lis [place], [place]   [phone] or [phone]   [place] and [place], [place] [phone]   [facility] [address],  [place], [place] [phone] Insurance/Medicaid/sponsorship through [facility] and Families [address], [address] [place]                                    [place], [place] [phone] [place] [address], [place] [phone]    Dr. Adele Schilder  [phone]   [facility] Dept. 1) [address], [place] 2) [place] 3)  [place] [address], [place] [phone]  [phone]   [place] [phone] or [phone] (After Hours)

## 2015-10-16 NOTE — ED Provider Notes (Signed)
CSN: [id number]     Arrival date & time 10/16/15  1637 History  By signing my name below, I, Hansel Feinstein, attest that this documentation has been prepared under the direction and in the presence of Shawn Joy, PA-C. Electronically Signed: Hansel Feinstein, ED Scribe. 10/16/2015. 5:22 PM.    Chief Complaint  Patient presents with  . Back Pain  . Leg Pain   The history is provided by the patient. No language interpreter was used.    HPI Comments: [name] is a 50 y.o. female who presents to the Emergency Department complaining of moderate, burning, 10/10 left lower back pain with radiation to the posterior left leg onset years ago and worsened 2 weeks ago. She also complains of erythematous, burning rash to the left lower back. Pt denies recent trauma, injury, heavy lifting, falls, twisting, bending. She states pain is worsened with ambulation, movement and twisting. Pt is not currently followed by a PCP. NKDA. She denies numbness, focal weakness, paresthesia, dysuria, bowel or bladder incontinence, fever, or any other complaints.   Past Medical History  Diagnosis Date  . Asthma   . Headache(784.0)   . Arthritis     rt shoulder  . Shortness of breath     on exertion  . Dysrhythmia     sometimes on exertion   Past Surgical History  Procedure Laterality Date  . Cesarean section  1995  . Robotic assisted total hysterectomy N/A 05/15/2013    Procedure: ATTEMPTED ROBOTIC TOTAL HYSTERECTOMY;  Surgeon: Lavonia Drafts, MD;  Location: [facility];  Service: Gynecology;  Laterality: N/A;  . Abdominal hysterectomy Bilateral 05/15/2013    Procedure: HYSTERECTOMY ABDOMINAL WITH BILATERAL SALPINGECTOMY;  Surgeon: Lavonia Drafts, MD;  Location: [facility];  Service: Gynecology;  Laterality: Bilateral;   No family history on file. Social History  Substance Use Topics  . Smoking status: Never Smoker   . Smokeless tobacco: Never Used  . Alcohol Use: No   OB History    Gravida Para Term  Preterm AB TAB SAB Ectopic Multiple Living   11 5 1 4 6  6   5      Review of Systems  Constitutional: Negative for fever.  Genitourinary: Negative for dysuria.  Musculoskeletal: Positive for back pain.  Skin: Positive for rash.  Neurological: Negative for weakness and numbness.   Allergies  Review of patient's allergies indicates no known allergies.  Home Medications   Prior to Admission medications   Medication Sig Start Date End Date Taking? Authorizing Provider  acyclovir (ZOVIRAX) 800 MG tablet Take 1 tablet (800 mg total) by mouth 5 (five) times daily. 10/16/15   Shawn C Joy, PA-C  albuterol (PROVENTIL HFA;VENTOLIN HFA) 108 (90 BASE) MCG/ACT inhaler Inhale 1-2 puffs into the lungs every 6 (six) hours as needed for wheezing. 12/15/14   Melony Overly, MD  ibuprofen (ADVIL,MOTRIN) 800 MG tablet Take 1 tablet (800 mg total) by mouth 3 (three) times daily. 10/16/15   Shawn C Joy, PA-C  lidocaine (LIDODERM) 5 % Place 1 patch onto the skin daily. Remove & Discard patch within 12 hours or as directed by MD 10/16/15   Lorayne Bender, PA-C  oxyCODONE-acetaminophen (PERCOCET/ROXICET) 5-325 MG tablet Take 1 tablet by mouth every 4 (four) hours as needed for severe pain. 10/01/15   Daleen Bo, MD  predniSONE (DELTASONE) 20 MG tablet Take 2 tablets (40 mg total) by mouth daily. 10/16/15   Shawn C Joy, PA-C   BP 154/84 mmHg  Pulse 85  Temp(Src)  98.7 F (37.1 C) (Oral)  Resp 22  SpO2 97%  LMP 07/23/2012 Physical Exam  Constitutional: She is oriented to person, place, and time. She appears well-developed and well-nourished.  HENT:  Head: Normocephalic and atraumatic.  Cardiovascular: Normal rate and intact distal pulses.   Pulmonary/Chest: Effort normal. No respiratory distress.  Musculoskeletal: Normal range of motion. She exhibits tenderness.  Left sacral muscular pain. No paraspinal tenderness.   Neurological: She is alert and oriented to person, place, and time. She has normal reflexes.  No  sensory deficits. Strength 5/5 in all extremities. No gait disturbance. Coordination intact.   Skin: Skin is warm and dry. Rash noted.  Maculopapular rash with a dermatomal distribution to the left buttocks and left lumbar.   Psychiatric: She has a normal mood and affect. Her behavior is normal.  Nursing note and vitals reviewed.   ED Course  Procedures (including critical care time) DIAGNOSTIC STUDIES: Oxygen Saturation is 97% on RA, normal by my interpretation.    COORDINATION OF CARE: 5:20 PM Discussed treatment plan with pt at bedside which includes IM antiinflammatory and steroid and pt agreed to plan.  MDM   Final diagnoses:  Sciatica of left side  Zoster    Patient presents to the ED for exacerbation of chronic lower back pain. No neurological deficits and patient has a normal neuro exam.  Patient is ambulatory.  No loss of bowel or bladder control.  No concern for cauda equina. No red flags. No fever, night sweats, weight loss, h/o cancer, IVDA, no recent procedure to back. No urinary symptoms suggestive of UTI.  Suspect sciatica. Supportive care and return precaution discussed. Pt advised to follow up with a PCP for continued care. PCP resource guide and information for [facility] and Wellness provided. Pt also complains of erythematous rash to the left lower back and buttock. Suspect shingles eruption. Will prescribe Lidoderm patches, prednisone, 800mg  acyclovir 5x/day x5 days, and ibuprofen. Appears safe for discharge at this time. Pt is agreeable to plan.    I personally performed the services described in this documentation, which was scribed in my presence. The recorded information has been reviewed and is accurate.  Filed Vitals:   10/16/15 1642  BP: 154/84  Pulse: 85  Temp: 98.7 F (37.1 C)  TempSrc: Oral  Resp: 22  SpO2: 97%     Lorayne Bender, PA-C 10/16/15 1836  Carmin Muskrat, MD 10/16/15 1901

## 2015-10-16 NOTE — ED Notes (Addendum)
Pt c/o low back pain ongoing for couple years. Pt describes pain as burning, red rash. Pt also c/o left leg pain causing difficulty in ambulation onset 09/30/15, pt seen here for same on that day. Pt ran out of prescribed pain medication.

## 2015-11-01 ENCOUNTER — Ambulatory Visit: Payer: Medicaid Other | Admitting: Family Medicine

## 2015-12-02 ENCOUNTER — Ambulatory Visit: Payer: Medicaid Other | Admitting: Family Medicine

## 2015-12-09 ENCOUNTER — Ambulatory Visit: Payer: Medicaid Other | Admitting: Family Medicine

## 2015-12-10 ENCOUNTER — Ambulatory Visit (INDEPENDENT_AMBULATORY_CARE_PROVIDER_SITE_OTHER): Payer: Self-pay | Admitting: Family Medicine

## 2015-12-10 ENCOUNTER — Encounter: Payer: Self-pay | Admitting: Family Medicine

## 2015-12-10 VITALS — BP 134/82 | HR 84 | Temp 98.3°F | Resp 18 | Ht 60.0 in | Wt 194.0 lb

## 2015-12-10 DIAGNOSIS — Z0000 Encounter for general adult medical examination without abnormal findings: Secondary | ICD-10-CM

## 2015-12-10 DIAGNOSIS — M1990 Unspecified osteoarthritis, unspecified site: Secondary | ICD-10-CM

## 2015-12-10 DIAGNOSIS — J45909 Unspecified asthma, uncomplicated: Secondary | ICD-10-CM

## 2015-12-10 MED ORDER — ALBUTEROL SULFATE HFA 108 (90 BASE) MCG/ACT IN AERS: 1.0000 | INHALATION_SPRAY | Freq: Four times a day (QID) | RESPIRATORY_TRACT | Status: DC | PRN

## 2015-12-10 MED ORDER — NAPROXEN 500 MG PO TABS: 500.0000 mg | ORAL_TABLET | Freq: Two times a day (BID) | ORAL | Status: DC

## 2015-12-10 NOTE — Progress Notes (Signed)
Patient ID: [name], female   DOB: 06/27/1966, 50 y.o.   MRN: [id number]   [name], is a 50 y.o. female  [id number]  [id number]  DOB - 1966/06/15  CC:  Chief Complaint  Patient presents with  . Establish Care       HPI: [name] is a 50 y.o. female here to establish care. She has a history of a dysrhythmia, anemia, arthritis, and asthma. She is currently on ibuprofen, which she reports is not helping her pain and albuterol as needed. Her major complaints are of back pain and heel pain. She has a history of headaches on occassions.  Health Maintenance:She is in need of a mammogram, colon cancer screening. She has had a hysterectomy. Her Tdap is up to date. She reports no tobacco, alcohol, or illicit drug use. She has had recent bloodwork but does need a lipid panel.  No Known Allergies Past Medical History  Diagnosis Date  . Asthma   . Headache(784.0)   . Arthritis     rt shoulder  . Shortness of breath     on exertion  . Dysrhythmia     sometimes on exertion   Current Outpatient Prescriptions on File Prior to Visit  Medication Sig Dispense Refill  . acyclovir (ZOVIRAX) 800 MG tablet Take 1 tablet (800 mg total) by mouth 5 (five) times daily. 25 tablet 0  . ibuprofen (ADVIL,MOTRIN) 800 MG tablet Take 1 tablet (800 mg total) by mouth 3 (three) times daily. 21 tablet 0  . lidocaine (LIDODERM) 5 % Place 1 patch onto the skin daily. Remove & Discard patch within 12 hours or as directed by MD 30 patch 0  . oxyCODONE-acetaminophen (PERCOCET/ROXICET) 5-325 MG tablet Take 1 tablet by mouth every 4 (four) hours as needed for severe pain. 10 tablet 0  . predniSONE (DELTASONE) 20 MG tablet Take 2 tablets (40 mg total) by mouth daily. 10 tablet 0   No current facility-administered medications on file prior to visit.   History reviewed. No pertinent family history. Social History   Social History  . Marital Status: Married    Spouse Name: N/A  .  Number of Children: N/A  . Years of Education: N/A   Occupational History  . Not on file.   Social History Main Topics  . Smoking status: Never Smoker   . Smokeless tobacco: Never Used  . Alcohol Use: No  . Drug Use: No  . Sexual Activity: No   Other Topics Concern  . Not on file   Social History Narrative    Review of Systems: Constitutional: Negative for fever, chills, appetite change, weight loss. Positive for fatigue and waxing and wanting appetite.  Skin: Negative for rashes or lesions of concern. HENT: Negative for ear pain, ear discharge.nose bleeds Eyes: Negative for pain, discharge, redness, itching and visual disturbance. Neck: Negative for pain, stiffness Respiratory: Negative for cough. Positive for occassional shortness of breath,   Cardiovascular: Negative for chest pain, palpitations and leg swelling. Gastrointestinal: Negative for abdominal pain, nausea, vomiting, diarrhea. Positive for constipation constipations Genitourinary: Negative for dysuria, urgency, frequency, hematuria,  Musculoskeletal: Positive for back pain, knee pain and heel pain.  Negative for joint  swelling, and gait problem.Negative for weakness. Neurological: Negative for dizziness, tremors, seizures, syncope,   light-headedness, numbness. Positive for occassional headaches.  Hematological: Negative for easy bruising or bleeding Psychiatric/Behavioral: Negative for depression, anxiety, decreased concentration, confusion   Objective:   Filed Vitals:   12/10/15 0930  BP: 134/82  Pulse: 84  Temp: 98.3 F (36.8 C)  Resp: 18    Physical Exam: Constitutional: Patient appears well-developed and well-nourished. No distress. HENT: Normocephalic, atraumatic, External right and left ear normal. Oropharynx is clear and moist.  Eyes: Conjunctivae and EOM are normal. PERRLA, no scleral icterus. Neck: Normal ROM. Neck supple. No lymphadenopathy, No thyromegaly. CVS: RRR, S1/S2 +, no murmurs, no  gallops, no rubs Pulmonary: Effort and breath sounds normal, no stridor, rhonchi, wheezes, rales.  Abdominal: Soft. Normoactive BS,, no distension, tenderness, rebound or guarding.  Musculoskeletal: Normal range of motion. No edema. There is tenderness of left heel pad. Neuro: Alert.Normal muscle tone coordination. Non-focal Skin: Skin is warm and dry. No rash noted. Not diaphoretic. No erythema. No pallor. Psychiatric: Normal mood and affect. Behavior, judgment, thought content normal.  Lab Results  Component Value Date   WBC 4.9 09/30/2015   HGB 11.4* 09/30/2015   HCT 35.8* 09/30/2015   MCV 81.0 09/30/2015   PLT 191 09/30/2015   Lab Results  Component Value Date   CREATININE 0.89 09/30/2015   BUN 12 09/30/2015   NA 143 09/30/2015   K 3.7 09/30/2015   CL 106 09/30/2015   CO2 28 09/30/2015    No results found for: HGBA1C Lipid Panel     Component Value Date/Time   CHOL 215* 10/14/2009 2144   TRIG 121 10/14/2009 2144   HDL 46 10/14/2009 2144   CHOLHDL 4.7 Ratio 10/14/2009 2144   VLDL 24 10/14/2009 2144   LDLCALC 145* 10/14/2009 2144       Assessment and plan:   1. Health care maintenance - Lipid panel - POC Hemoccult Bld/Stl (3-Cd Home Screen); Future  2. Asthma, unspecified asthma severity, uncomplicated  - albuterol (PROVENTIL HFA;VENTOLIN HFA) 108 (90 Base) MCG/ACT inhaler; Inhale 1-2 puffs into the lungs every 6 (six) hours as needed for wheezing.  Dispense: 1 Inhaler; Refill: 0  3. Arthritis  - naproxen (NAPROSYN) 500 MG tablet; Take 1 tablet (500 mg total) by mouth 2 (two) times daily with a meal.  Dispense: 60 tablet; Refill: 2  4. Plantar Fascitis.  - I have recommended good supportive shoes with heel pad inserts. -If and went we can, will refer to podiatrist.   Return in about 6 months (around 06/11/2016).  The patient was given clear instructions to go to ER or return to medical center if symptoms don't improve, worsen or new problems develop. The  patient verbalized understanding.    Micheline Chapman FNP  12/10/2015, 2:46 PM

## 2015-12-10 NOTE — Patient Instructions (Signed)
Call number provided to arrange a mammogram to check for breast cancer. Return stool cards to check for colon cancer. Contact number provide to see about getting an orange card or cone discount card We will let you know if anything in labs require attention.

## 2015-12-10 NOTE — Progress Notes (Signed)
Patient is here to establish care.  Patient complains of left side pain for 2-3 years. Patient complains of the heel of her left foot hurting. Pain is scaled currently at a 10.   Patient is requesting refills on medications.

## 2015-12-13 ENCOUNTER — Other Ambulatory Visit: Payer: Self-pay | Admitting: Family Medicine

## 2015-12-13 MED ORDER — PRAVASTATIN SODIUM 40 MG PO TABS: 40.0000 mg | ORAL_TABLET | Freq: Every day | ORAL | Status: DC

## 2015-12-15 LAB — LIPID PANEL
CHOLESTEROL: 239 mg/dL — AB (ref 125–200)
HDL: 52 mg/dL (ref >=46)
LDL Cholesterol: 173 mg/dL — ABNORMAL HIGH (ref <130)
TRIGLYCERIDES: 69 mg/dL (ref <150)
Total CHOL/HDL Ratio: 4.6 Ratio (ref <=5.0)
VLDL: 14 mg/dL (ref <30)

## 2015-12-16 ENCOUNTER — Other Ambulatory Visit (INDEPENDENT_AMBULATORY_CARE_PROVIDER_SITE_OTHER): Payer: Self-pay

## 2015-12-16 DIAGNOSIS — Z0000 Encounter for general adult medical examination without abnormal findings: Secondary | ICD-10-CM

## 2015-12-16 LAB — POC HEMOCCULT BLD/STL (HOME/3-CARD/SCREEN)
FECAL OCCULT BLD: NEGATIVE
FECAL OCCULT BLD: NEGATIVE
Fecal Occult Blood, POC: NEGATIVE

## 2016-02-20 ENCOUNTER — Observation Stay (HOSPITAL_COMMUNITY)
Admission: EM | Admit: 2016-02-20 | Discharge: 2016-02-21 | Disposition: A | Discharge status: Home Health | Payer: Medicaid Other | Attending: Family Medicine | Admitting: Family Medicine

## 2016-02-20 ENCOUNTER — Emergency Department (HOSPITAL_COMMUNITY): Payer: Medicaid Other

## 2016-02-20 ENCOUNTER — Encounter (HOSPITAL_COMMUNITY): Payer: Self-pay

## 2016-02-20 DIAGNOSIS — Z90710 Acquired absence of both cervix and uterus: Secondary | ICD-10-CM | POA: Insufficient documentation

## 2016-02-20 DIAGNOSIS — J4520 Mild intermittent asthma, uncomplicated: Secondary | ICD-10-CM | POA: Insufficient documentation

## 2016-02-20 DIAGNOSIS — E0500 Thyrotoxicosis with diffuse goiter without thyrotoxic crisis or storm: Secondary | ICD-10-CM | POA: Insufficient documentation

## 2016-02-20 DIAGNOSIS — R112 Nausea with vomiting, unspecified: Secondary | ICD-10-CM | POA: Insufficient documentation

## 2016-02-20 DIAGNOSIS — R42 Dizziness and giddiness: Principal | ICD-10-CM | POA: Insufficient documentation

## 2016-02-20 DIAGNOSIS — D649 Anemia, unspecified: Secondary | ICD-10-CM | POA: Insufficient documentation

## 2016-02-20 DIAGNOSIS — J45909 Unspecified asthma, uncomplicated: Secondary | ICD-10-CM | POA: Insufficient documentation

## 2016-02-20 DIAGNOSIS — I10 Essential (primary) hypertension: Secondary | ICD-10-CM | POA: Insufficient documentation

## 2016-02-20 DIAGNOSIS — E785 Hyperlipidemia, unspecified: Secondary | ICD-10-CM | POA: Insufficient documentation

## 2016-02-20 DIAGNOSIS — E0590 Thyrotoxicosis, unspecified without thyrotoxic crisis or storm: Secondary | ICD-10-CM | POA: Insufficient documentation

## 2016-02-20 DIAGNOSIS — R531 Weakness: Secondary | ICD-10-CM | POA: Insufficient documentation

## 2016-02-20 LAB — COMPREHENSIVE METABOLIC PANEL
ALBUMIN: 3.5 g/dL (ref 3.5–5.0)
ALT: 17 U/L (ref 14–54)
AST: 20 U/L (ref 15–41)
Alkaline Phosphatase: 55 U/L (ref 38–126)
Anion gap: 3 — ABNORMAL LOW (ref 5–15)
BUN: 10 mg/dL (ref 6–20)
CHLORIDE: 110 mmol/L (ref 101–111)
CO2: 27 mmol/L (ref 22–32)
CREATININE: 0.78 mg/dL (ref 0.44–1.00)
Calcium: 8.8 mg/dL — ABNORMAL LOW (ref 8.9–10.3)
GFR calc Af Amer: >60 mL/min (ref >60)
GLUCOSE: 102 mg/dL — AB (ref 65–99)
POTASSIUM: 3.8 mmol/L (ref 3.5–5.1)
SODIUM: 140 mmol/L (ref 135–145)
Total Bilirubin: 0.2 mg/dL — ABNORMAL LOW (ref 0.3–1.2)
Total Protein: 6.3 g/dL — ABNORMAL LOW (ref 6.5–8.1)

## 2016-02-20 LAB — URINALYSIS, ROUTINE W REFLEX MICROSCOPIC
BILIRUBIN URINE: NEGATIVE
GLUCOSE, UA: NEGATIVE mg/dL
Ketones, ur: NEGATIVE mg/dL
Leukocytes, UA: NEGATIVE
Nitrite: NEGATIVE
Protein, ur: NEGATIVE mg/dL
SPECIFIC GRAVITY, URINE: 1.014 (ref 1.005–1.030)
pH: 6.5 (ref 5.0–8.0)

## 2016-02-20 LAB — DIFFERENTIAL
BASOS ABS: 0 10*3/uL (ref 0.0–0.1)
BASOS PCT: 0 %
EOS ABS: 0 10*3/uL (ref 0.0–0.7)
Eosinophils Relative: 1 %
Lymphocytes Relative: 45 %
Lymphs Abs: 1.8 10*3/uL (ref 0.7–4.0)
MONOS PCT: 10 %
Monocytes Absolute: 0.4 10*3/uL (ref 0.1–1.0)
NEUTROS ABS: 1.7 10*3/uL (ref 1.7–7.7)
NEUTROS PCT: 44 %

## 2016-02-20 LAB — CBC
HEMATOCRIT: 37.2 % (ref 36.0–46.0)
Hemoglobin: 11.6 g/dL — ABNORMAL LOW (ref 12.0–15.0)
MCH: 25.4 pg — ABNORMAL LOW (ref 26.0–34.0)
MCHC: 31.2 g/dL (ref 30.0–36.0)
MCV: 81.6 fL (ref 78.0–100.0)
Platelets: 182 10*3/uL (ref 150–400)
RBC: 4.56 MIL/uL (ref 3.87–5.11)
RDW: 13.8 % (ref 11.5–15.5)
WBC: 3.9 10*3/uL — AB (ref 4.0–10.5)

## 2016-02-20 LAB — RAPID URINE DRUG SCREEN, HOSP PERFORMED
AMPHETAMINES: NOT DETECTED
BARBITURATES: NOT DETECTED
BENZODIAZEPINES: NOT DETECTED
COCAINE: NOT DETECTED
Opiates: NOT DETECTED
Tetrahydrocannabinol: NOT DETECTED

## 2016-02-20 LAB — ETHANOL

## 2016-02-20 LAB — APTT: aPTT: 32 seconds (ref 24–37)

## 2016-02-20 LAB — URINE MICROSCOPIC-ADD ON

## 2016-02-20 LAB — I-STAT TROPONIN, ED: Troponin i, poc: 0 ng/mL (ref 0.00–0.08)

## 2016-02-20 LAB — PROTIME-INR
INR: 1.05 (ref 0.00–1.49)
Prothrombin Time: 13.9 seconds (ref 11.6–15.2)

## 2016-02-20 LAB — TSH: TSH: 0.148 u[IU]/mL — ABNORMAL LOW (ref 0.350–4.500)

## 2016-02-20 MED ORDER — ALBUTEROL SULFATE (2.5 MG/3ML) 0.083% IN NEBU: 2.5000 mg | INHALATION_SOLUTION | Freq: Four times a day (QID) | RESPIRATORY_TRACT | Status: DC | PRN

## 2016-02-20 MED ORDER — ACETAMINOPHEN 325 MG PO TABS: 650.0000 mg | ORAL_TABLET | Freq: Four times a day (QID) | ORAL | Status: DC | PRN

## 2016-02-20 MED ORDER — ENOXAPARIN SODIUM 40 MG/0.4ML ~~LOC~~ SOLN
40.0000 mg | SUBCUTANEOUS | Status: DC
  Administered 2016-02-20: 40 mg via SUBCUTANEOUS
  Filled 2016-02-20: qty 0.4

## 2016-02-20 MED ORDER — PRAVASTATIN SODIUM 40 MG PO TABS
40.0000 mg | ORAL_TABLET | Freq: Every day | ORAL | Status: DC
  Administered 2016-02-21: 40 mg via ORAL
  Filled 2016-02-20: qty 1

## 2016-02-20 MED ORDER — MECLIZINE HCL 25 MG PO TABS
25.0000 mg | ORAL_TABLET | Freq: Once | ORAL | Status: AC
  Administered 2016-02-20: 25 mg via ORAL
  Filled 2016-02-20: qty 1

## 2016-02-20 MED ORDER — MECLIZINE HCL 12.5 MG PO TABS: 25.0000 mg | ORAL_TABLET | Freq: Three times a day (TID) | ORAL | Status: DC | PRN

## 2016-02-20 MED ORDER — MECLIZINE HCL 25 MG PO TABS
50.0000 mg | ORAL_TABLET | Freq: Once | ORAL | Status: AC
Start: 2016-02-20 — End: 2016-02-20
  Administered 2016-02-20: 50 mg via ORAL
  Filled 2016-02-20: qty 2

## 2016-02-20 MED ORDER — ACETAMINOPHEN 650 MG RE SUPP: 650.0000 mg | Freq: Four times a day (QID) | RECTAL | Status: DC | PRN

## 2016-02-20 MED ORDER — POLYETHYLENE GLYCOL 3350 17 G PO PACK: 17.0000 g | PACK | Freq: Every day | ORAL | Status: DC | PRN

## 2016-02-20 MED ORDER — DIAZEPAM 5 MG/ML IJ SOLN
5.0000 mg | Freq: Once | INTRAMUSCULAR | Status: AC
  Administered 2016-02-20: 5 mg via INTRAVENOUS
  Filled 2016-02-20: qty 2

## 2016-02-20 MED ORDER — ONDANSETRON HCL 4 MG PO TABS: 4.0000 mg | ORAL_TABLET | Freq: Four times a day (QID) | ORAL | Status: DC | PRN

## 2016-02-20 MED ORDER — ONDANSETRON HCL 4 MG/2ML IJ SOLN: 4.0000 mg | Freq: Four times a day (QID) | INTRAMUSCULAR | Status: DC | PRN

## 2016-02-20 NOTE — ED Notes (Addendum)
Patient here with dizziness, weakness, nausea and 1 episode of vomiting since Friday. Denies trauma. Alert and oriented. Ambulatory at triage with slow and steady gait

## 2016-02-20 NOTE — ED Notes (Signed)
Attempted report 

## 2016-02-20 NOTE — ED Notes (Signed)
Transported to [employer] [facility] [employer] by Lanny Hurst, EMT

## 2016-02-20 NOTE — ED Notes (Signed)
Patient transported to CT 

## 2016-02-20 NOTE — H&P (Signed)
Family Medicine Teaching [facility] Admission History and Physical Service Pager: [phone]  Patient name: [name] Medical record number: [id number] Date of birth: 1966/03/16 Age: 50 y.o. Gender: female  Primary Care Provider: Concepcion Living, NP Consultants: None Code Status: Full  Chief Complaint: Dizziness  Assessment and Plan: [name] is a 50 y.o. female presenting with vertigo. PMH is significant for normocytic anemia, asthma, hyperlipidemia, hx herpes zoster.  Vertigo / Weakness: CT head negative for acute abnormality. MRI brain negative for acute changes. EKG with sinus rhythm. Central causes of vertigo such as stroke or acoustic neuroma are ruled out with negative imaging. Differentials include BPPV, vestibular neuritis, Herpes Zoster oticus, Meniere disease, and vestibular migraine. BPPV is possible, given that she feels like the room is spinning when she moves her head, however she is having some dizziness without head movement and Dix-Hallpike maneuver was negative for nystagmus. Vestibular neuritis is less likely without recent viral symptoms. Herpes zoster oticus less likely, as Pt does not have facial paralysis, ear pain, or vesicles in the auditory canal. Meniere disease is also less likely, as Pt denies tinnitus, hearing loss, or ear fullness. Vestibular migraine is also less likely, as Pt has never had a headache with the dizziness. No new meds or no recent aminoglycoside use that could be contributing. - Admit to telemetry under observation status, attending Dr. Deirdre Priest. - s/p Meclizine 25mg  x 1 and Valium x 1 in the ED - Will give another 50mg  Meclizine tonight, then continue Meclizine 25mg  tid prn tomorrow. - Zofran prn for nausea - Vestibular rehab consult in the morning. - Neuro checks every 4 hours - Cardiac monitoring for tonight. Can likely d/c in the am if no events. - PT/OT consult for weakness  Normocytic Anemia: Has a hx of  menorrhagia, now s/p hysterectomy. Hgb 11.6, MCV 81 in the ED. - Monitor  Enlarged thryoid: Symmetric thyromegaly noted on exam, without nodules. Last TSH 11/2012 was 0.202. HR in the 60s-70s. - Will check a TSH  Asthma: Occasional expiratory wheezes auscultated on lung exam, normal work of breathing. Stable on room air. - Albuterol q6hrs prn  Hyperlipidemia: Last lipid panel 11/2015: Chol 239, TG 69, HDL 52, LDL 173 - Continue home med: Pravastatin 40mg  daily  Hypertension: Pt does not have a diagnosis of HTN, but BPs to 155/73 in the ED. - Consider addition of anti-hypertensive  Social: Pt does not have a PCP and has presented to the ED frequently.  - SW consult  FEN/GI: Heart healthy diet Prophylaxis: Lovenox  Disposition: Admit to telemetry under observation status, attending Dr. Deirdre Priest.  History of Present Illness:  [name] is a 50 y.o. female with a PMH of menorrhagia (now s/p hysterectomy), normocytic anemia, asthma, hyperlipidemia, recent hx of herpes zoster who presented to the ED with "feeling like the room is spinning" for 2 days. The dizziness has gotten progressively worse since Friday. The dizziness is worse with standing or turning her head to the right or left. The dizziness is better with sitting completely still, keeping her head still, or extending her head to look upwards. The dizziness is associated with nausea and "feeling like her head is heavy". She has vomited clear liquid x 1. She denies any fevers, chills, headache, changes in vision, changes in hearing, ear fullness, or tinnitus. She has not tried taking any medications at home. She denies any recent viral symptoms. She has not taken any new medications. She has been eating and drinking like normal. She states  she used to feel dizzy during her periods in the past, but has never had dizziness like this before.  She denies any chest pain, abdominal pain, or leg swelling. She endorses shortness of breath  and wheezing with walking. She has a history of asthma, but has been out of her Albuterol inhaler recently.   In the ED, EKG showed normal sinus rhythm. CT head was normal. MRI head was performed to rule out stroke and did not show any acute changes.  Review Of Systems: Per HPI with the following additions: None Otherwise the remainder of the systems were negative.  Patient Active Problem List   Diagnosis Date Noted  . Anemia 12/05/2012  . PNA (pneumonia) 12/04/2012  . Menorrhagia 12/04/2012    Past Medical History: Past Medical History  Diagnosis Date  . Asthma   . Headache(784.0)   . Arthritis     rt shoulder  . Shortness of breath     on exertion  . Dysrhythmia     sometimes on exertion    Past Surgical History: Past Surgical History  Procedure Laterality Date  . Cesarean section  1995  . Robotic assisted total hysterectomy N/A 05/15/2013    Procedure: ATTEMPTED ROBOTIC TOTAL HYSTERECTOMY;  Surgeon: Willodean Rosenthal, MD;  Location: [facility];  Service: Gynecology;  Laterality: N/A;  . Abdominal hysterectomy Bilateral 05/15/2013    Procedure: HYSTERECTOMY ABDOMINAL WITH BILATERAL SALPINGECTOMY;  Surgeon: Willodean Rosenthal, MD;  Location: [facility];  Service: Gynecology;  Laterality: Bilateral;    Social History: Social History  Substance Use Topics  . Smoking status: Never Smoker   . Smokeless tobacco: Never Used  . Alcohol Use: No   Additional social history: None Please also refer to relevant sections of EMR.  Family History: No family history on file. Non-contributory.  Allergies and Medications: No Known Allergies No current facility-administered medications on file prior to encounter.   Current Outpatient Prescriptions on File Prior to Encounter  Medication Sig Dispense Refill  . albuterol (PROVENTIL HFA;VENTOLIN HFA) 108 (90 Base) MCG/ACT inhaler Inhale 1-2 puffs into the lungs every 6 (six) hours as needed for wheezing. 1 Inhaler 0  . ibuprofen  (ADVIL,MOTRIN) 800 MG tablet Take 1 tablet (800 mg total) by mouth 3 (three) times daily. 21 tablet 0  . naproxen (NAPROSYN) 500 MG tablet Take 1 tablet (500 mg total) by mouth 2 (two) times daily with a meal. 60 tablet 2  . acyclovir (ZOVIRAX) 800 MG tablet Take 1 tablet (800 mg total) by mouth 5 (five) times daily. 25 tablet 0  . lidocaine (LIDODERM) 5 % Place 1 patch onto the skin daily. Remove & Discard patch within 12 hours or as directed by MD (Patient not taking: Reported on 02/20/2016) 30 patch 0  . pravastatin (PRAVACHOL) 40 MG tablet Take 1 tablet (40 mg total) by mouth daily. (Patient not taking: Reported on 02/20/2016) 90 tablet 1  . predniSONE (DELTASONE) 20 MG tablet Take 2 tablets (40 mg total) by mouth daily. (Patient not taking: Reported on 02/20/2016) 10 tablet 0    Objective: BP 155/73 mmHg  Pulse 72  Temp(Src) 97.9 F (36.6 C) (Oral)  Resp 11  SpO2 98%  LMP 07/23/2012 Exam: General: Tired-appearing woman, keeping head still, occasionally closing her eyes Eyes: PERRLA, EOMI, no scleral icterus ENTM: Nose normal, oropharynx clear, TMs clear and slightly yellow, MMM Neck: Symmetric thyromegaly without nodules, full ROM, no lymphadenopathy, no carotid bruits Cardiovascular: RRR, no m/r/g, 2+ DP pulses bilaterally Respiratory: CTAB, occasional expiratory wheeze,  normal work of breathing on room air Abdomen: Soft, non-tender, non-distended MSK: No edema, normal tone Skin: No rashes or lesions Neuro: Awake, alert, CN 2-12 grossly intact, 5/5 grip strength bilaterally, Dix-Hallpike maneuver negative for nystagmus bilaterally, head impulse test negative for saccad, note patient was dizzy throughout exam prior to and during provocative maneuvers. Gait not assessed due to patient refusal. Psych: Appropriate affect, normal behavior  Labs and Imaging: CBC BMET   Recent Labs Lab 02/20/16 1114  WBC 3.9*  HGB 11.6*  HCT 37.2  PLT 182    Recent Labs Lab 02/20/16 1114  NA 140   K 3.8  CL 110  CO2 27  BUN 10  CREATININE 0.78  GLUCOSE 102*  CALCIUM 8.8*     EKG: sinus rhythm, and inferior T wave abnormalities stable/diminished from previous tracing.  CT head: negative for acute abnormalities MRI: negative for acute changes  Campbell Stall, MD 02/20/2016, 4:25 PM PGY-1, [facility] Family Medicine FPTS Intern pager: [phone], text pages welcome  I have seen and evaluated the patient with Dr. Nancy Marus. I am in agreement with the note above in its revised form. My additions are in red.  Hooria Gasparini B. Jarvis Newcomer, MD, PGY-3 02/20/2016 7:34 PM

## 2016-02-20 NOTE — ED Provider Notes (Signed)
CSN: [id number]     Arrival date & time 02/20/16  1021 History   First MD Initiated Contact with Patient 02/20/16 1053     Chief Complaint  Patient presents with  . Dizziness  . Weakness     (Consider location/radiation/quality/duration/timing/severity/associated sxs/prior Treatment) HPI  50 year old female presents with dizziness nausea and vomiting starting 2 days ago. Patient speaks Dyann Ruddle and the son at the bedside translates. Patient constantly feels dizzy like things are spinning. She has blurry vision that she describes as things moving in her vision but otherwise she can still see. It is much worse when standing but still present at rest. Feels nauseated and vomits, especially when standing. No headache but her head feels "heavy". No chest pain, dyspnea, neck pain/stiffness. No focal weakness but feels all over weak. Feels similar to when she was here 1 year ago.  Past Medical History  Diagnosis Date  . Asthma   . Headache(784.0)   . Arthritis     rt shoulder  . Shortness of breath     on exertion  . Dysrhythmia     sometimes on exertion   Past Surgical History  Procedure Laterality Date  . Cesarean section  1995  . Robotic assisted total hysterectomy N/A 05/15/2013    Procedure: ATTEMPTED ROBOTIC TOTAL HYSTERECTOMY;  Surgeon: Lavonia Drafts, MD;  Location: [facility];  Service: Gynecology;  Laterality: N/A;  . Abdominal hysterectomy Bilateral 05/15/2013    Procedure: HYSTERECTOMY ABDOMINAL WITH BILATERAL SALPINGECTOMY;  Surgeon: Lavonia Drafts, MD;  Location: [facility];  Service: Gynecology;  Laterality: Bilateral;   No family history on file. Social History  Substance Use Topics  . Smoking status: Never Smoker   . Smokeless tobacco: Never Used  . Alcohol Use: No   OB History    Gravida Para Term Preterm AB TAB SAB Ectopic Multiple Living   11 5 1 4 6  6   5      Review of Systems  Constitutional: Negative for fever.  Respiratory: Negative for shortness of  breath.   Cardiovascular: Negative for chest pain.  Gastrointestinal: Positive for nausea and vomiting.  Genitourinary: Positive for flank pain (chronic flank pain). Negative for dysuria.  Musculoskeletal: Negative for neck pain.  Neurological: Positive for dizziness and weakness (generalized). Negative for numbness and headaches.  All other systems reviewed and are negative.     Allergies  Review of patient's allergies indicates no known allergies.  Home Medications   Prior to Admission medications   Medication Sig Start Date End Date Taking? Authorizing Provider  albuterol (PROVENTIL HFA;VENTOLIN HFA) 108 (90 Base) MCG/ACT inhaler Inhale 1-2 puffs into the lungs every 6 (six) hours as needed for wheezing. 12/10/15  Yes Micheline Chapman, NP  ibuprofen (ADVIL,MOTRIN) 800 MG tablet Take 1 tablet (800 mg total) by mouth 3 (three) times daily. 10/16/15  Yes Shawn C Joy, PA-C  naproxen (NAPROSYN) 500 MG tablet Take 1 tablet (500 mg total) by mouth 2 (two) times daily with a meal. 12/10/15  Yes Micheline Chapman, NP  acyclovir (ZOVIRAX) 800 MG tablet Take 1 tablet (800 mg total) by mouth 5 (five) times daily. 10/16/15   Shawn C Joy, PA-C  lidocaine (LIDODERM) 5 % Place 1 patch onto the skin daily. Remove & Discard patch within 12 hours or as directed by MD Patient not taking: Reported on 02/20/2016 10/16/15   Shawn C Joy, PA-C  pravastatin (PRAVACHOL) 40 MG tablet Take 1 tablet (40 mg total) by mouth daily. Patient not taking:  Reported on 02/20/2016 12/13/15   Micheline Chapman, NP  predniSONE (DELTASONE) 20 MG tablet Take 2 tablets (40 mg total) by mouth daily. Patient not taking: Reported on 02/20/2016 10/16/15   Shawn C Joy, PA-C   BP 147/85 mmHg  Pulse 72  Temp(Src) 98.3 F (36.8 C) (Oral)  Resp 22  SpO2 98%  LMP 07/23/2012 Physical Exam  Constitutional: She is oriented to person, place, and time. She appears well-developed and well-nourished.  HENT:  Head: Normocephalic and atraumatic.   Right Ear: External ear normal.  Left Ear: External ear normal.  Nose: Nose normal.  Eyes: EOM are normal. Pupils are equal, round, and reactive to light. Right eye exhibits no discharge. Left eye exhibits no discharge.  Neck: Normal range of motion. Neck supple.  Cardiovascular: Normal rate, regular rhythm and normal heart sounds.   Pulmonary/Chest: Effort normal and breath sounds normal.  Abdominal: Soft. There is no tenderness.  Neurological: She is alert and oriented to person, place, and time.  CN 3-12 grossly intact. 5/5 strength in all 4 extremities. Normal finger to nose. Unable to ambulate patient, she feels too dizzy and is off balance when I stand her  Skin: Skin is warm and dry.  Nursing note and vitals reviewed.   ED Course  Procedures (including critical care time) Labs Review Labs Reviewed  CBC - Abnormal; Notable for the following:    WBC 3.9 (*)    Hemoglobin 11.6 (*)    MCH 25.4 (*)    All other components within normal limits  COMPREHENSIVE METABOLIC PANEL - Abnormal; Notable for the following:    Glucose, Bld 102 (*)    Calcium 8.8 (*)    Total Protein 6.3 (*)    Total Bilirubin 0.2 (*)    Anion gap 3 (*)    All other components within normal limits  URINALYSIS, ROUTINE W REFLEX MICROSCOPIC (NOT AT [facility]) - Abnormal; Notable for the following:    Hgb urine dipstick SMALL (*)    All other components within normal limits  URINE MICROSCOPIC-ADD ON - Abnormal; Notable for the following:    Squamous Epithelial / LPF 0-5 (*)    Bacteria, UA RARE (*)    All other components within normal limits  ETHANOL  PROTIME-INR  APTT  DIFFERENTIAL  URINE RAPID DRUG SCREEN, HOSP PERFORMED  TSH  I-STAT TROPOININ, ED    Imaging Review Ct Head Wo Contrast  02/20/2016  CLINICAL DATA:  Dizziness, nausea and vomiting, and weakness for 2 days. EXAM: CT HEAD WITHOUT CONTRAST TECHNIQUE: Contiguous axial images were obtained from the base of the skull through the vertex without  intravenous contrast. COMPARISON:  11/20/2013 and 03/03/2008 FINDINGS: No evidence of intracranial hemorrhage, brain edema, or other signs of acute infarction. No evidence of intracranial mass lesion or mass effect. No abnormal extraaxial fluid collections identified. Ventricles are normal in size. 13 mm right frontal dural calcification remains stable compared to previous exams. Incidentally noted mucous retention cyst in the left maxillary sinus. IMPRESSION: No acute intracranial abnormality. Stable small right frontal dural calcification incidentally noted. Electronically Signed   By: Earle Gell M.D.   On: 02/20/2016 11:54   Mr Brain Wo Contrast  02/20/2016  CLINICAL DATA:  Dizziness, weakness, nausea. One episode of vomiting over the last 2 days. EXAM: MRI HEAD WITHOUT CONTRAST TECHNIQUE: Multiplanar, multiecho pulse sequences of the brain and surrounding structures were obtained without intravenous contrast. COMPARISON:  CT head without contrast from the same day. MRI brain 03/03/2008  FINDINGS: The diffusion-weighted images demonstrate no evidence for acute or subacute infarction. Mild periventricular and subcortical T2 changes bilaterally are slightly greater than expected for age. This is not significantly changed from the prior exam accounting for technical differences in the scanner. The ventricles are of normal size. No significant extra-axial fluid collection is present. Flow is present in the major intracranial arteries. The globes and orbits are intact. A chronic polyp or mucous retention cyst is noted posteriorly in the left maxillary sinus. The paranasal sinuses and the mastoid air cells are otherwise clear. The skullbase is within normal limits. Midline sagittal images are unremarkable. IMPRESSION: 1. Stable mild periventricular and subcortical T2 changes, slightly advanced for age. The finding is nonspecific but can be seen in the setting of chronic microvascular ischemia, a demyelinating process  such as multiple sclerosis, vasculitis, complicated migraine headaches, or as the sequelae of a prior infectious or inflammatory process. 2. No acute intracranial abnormality or significant interval change to account for the patient's acute symptoms. Electronically Signed   By: San Morelle M.D.   On: 02/20/2016 14:36   I have personally reviewed and evaluated these images and lab results as part of my medical decision-making.   EKG Interpretation   Date/Time:  Sunday February 20 2016 12:05:26 EDT Ventricular Rate:  61 PR Interval:  160 QRS Duration: 89 QT Interval:  422 QTC Calculation: 425 R Axis:   38 Text Interpretation:  Sinus rhythm Borderline T abnormalities, inferior  leads ST/T wave changes less prominent now compared to Jan 2017 Confirmed  by Regenia Skeeter MD, [place] [phone]) on 02/20/2016 12:31:50 PM      MDM   Final diagnoses:  Vertigo    Patient is unable to ambulate despite antivert and valium. Still very dizzy and appeared like she would fall during gait testing. Nonspecific MRI but no infarct. Admit to family practice for observation given she cannot ambulate. Appears to be severe peripheral vertigo.    Sherwood Gambler, MD 02/20/16 [phone]

## 2016-02-20 NOTE — ED Notes (Signed)
Attempted to ambulate pt with 2 person assist and pt states she is dizzy and needs to sit back down after 2 small steps forward.

## 2016-02-20 NOTE — ED Notes (Signed)
Taken to [facility] via wheelchair. Pt very dizzy with position changes. Will make MD aware.

## 2016-02-20 NOTE — ED Notes (Signed)
Admitting MD/Residents at bedside

## 2016-02-20 NOTE — ED Notes (Signed)
Attempted report again. 

## 2016-02-20 NOTE — ED Notes (Signed)
Report given to 5W and then charge RN on 5W informed RN taking report that is a float nurse that they are unable to take pt due to neuro checks.  Receiving RN called bed placement and informed them that she was fine taking pt but that charge RN states that floor does not take neuro checks.  Per Justice Deeds, RN that received report pt will go to 5M10.  Lenna Sciara, Agricultural consultant notified.

## 2016-02-20 NOTE — ED Notes (Signed)
Spoke with bed control to confirm bed assignment.  Pt to go to 5C09.  Bed was assigned at 1725 and ready at 1731 per bed control.

## 2016-02-21 DIAGNOSIS — E785 Hyperlipidemia, unspecified: Secondary | ICD-10-CM | POA: Diagnosis not present

## 2016-02-21 DIAGNOSIS — J4520 Mild intermittent asthma, uncomplicated: Secondary | ICD-10-CM | POA: Insufficient documentation

## 2016-02-21 DIAGNOSIS — E0590 Thyrotoxicosis, unspecified without thyrotoxic crisis or storm: Secondary | ICD-10-CM | POA: Insufficient documentation

## 2016-02-21 DIAGNOSIS — R42 Dizziness and giddiness: Secondary | ICD-10-CM | POA: Diagnosis not present

## 2016-02-21 LAB — T4, FREE: FREE T4: 0.98 ng/dL (ref 0.61–1.12)

## 2016-02-21 MED ORDER — MECLIZINE HCL 25 MG PO TABS: 12.5000 mg | ORAL_TABLET | Freq: Two times a day (BID) | ORAL | Status: DC | PRN

## 2016-02-21 NOTE — Evaluation (Signed)
Physical Therapy Evaluation Patient Details Name: [name] MRN: [id number] DOB: 1966-02-10 Today's Date: 02/21/2016   History of Present Illness  [name] is a 50 y.o. female presenting with vertigo. PMH is significant for normocytic anemia, asthma, hyperlipidemia, hx herpes zoster.  Clinical Impression  Pt admitted with dizziness however pt with no complaints of dizziness during PT session despite change in positions and ambulation. Attempted to assess for BPPV however pt unable to follow commands to complete. Pt unable to track finger but unsure if that was due to language barrier or true dysfunction. Pt able to amb 100' with RW and stand and brush teeth with supervision/min guard. Pt c/o "i'm so weak". Pt to benefit from 24/7 supervision as pt was indep PTA and working and now requires assist for safe OOB mobility.    Follow Up Recommendations No PT follow up;Supervision/Assistance - 24 hour    Equipment Recommendations  Rolling walker with 5" wheels    Recommendations for Other Services       Precautions / Restrictions Precautions Precautions: Fall Restrictions Weight Bearing Restrictions: No      Mobility  Bed Mobility Overal bed mobility: Modified Independent             General bed mobility comments: hob elevated, used bed rail, increased time  Transfers Overall transfer level: Needs assistance Equipment used: Rolling walker (2 wheeled) Transfers: Sit to/from Stand Sit to Stand: Supervision         General transfer comment: v/c's for hand placement, increased time  Ambulation/Gait Ambulation/Gait assistance: Min guard Ambulation Distance (Feet): 100 Feet Assistive device: Rolling walker (2 wheeled) Gait Pattern/deviations: Step-through pattern Gait velocity: slow Gait velocity interpretation: Below normal speed for age/gender General Gait Details: no episodes of LOB, slow, sleepy, no c/o of dizzines  Stairs            Wheelchair  Mobility    Modified Rankin (Stroke Patients Only)       Balance Overall balance assessment: Needs assistance Sitting-balance support: Feet supported;No upper extremity supported Sitting balance-Leahy Scale: Fair     Standing balance support: During functional activity Standing balance-Leahy Scale: Good Standing balance comment: pt able to stand at sink and brush teeth without instability or additional support                             Pertinent Vitals/Pain Pain Assessment: No/denies pain    Home Living Family/patient expects to be discharged to:: Private residence Living Arrangements: Children Available Help at Discharge: Family;Available PRN/intermittently (1 dtr works but reports 1 of her 5 kids can be there) Type of Home: Apartment Home Access: Stairs to enter Entrance Stairs-Rails: Right Entrance Stairs-Number of Steps: fligh Home Layout: One level        Prior Function Level of Independence: Independent         Comments: reports she works but unable to remember name of company     Hand Dominance   Dominant Hand: Right    Extremity/Trunk Assessment   Upper Extremity Assessment: Generalized weakness           Lower Extremity Assessment: Generalized weakness      Cervical / Trunk Assessment: Normal  Communication   Communication: No difficulties  Cognition Arousal/Alertness: Lethargic Behavior During Therapy: Flat affect Overall Cognitive Status: Impaired/Different from baseline                      General Comments  Exercises        Assessment/Plan    PT Assessment Patient needs continued PT services  PT Diagnosis Difficulty walking;Abnormality of gait;Generalized weakness   PT Problem List Decreased strength;Decreased range of motion;Decreased activity tolerance;Decreased balance;Decreased mobility;Decreased coordination;Decreased cognition  PT Treatment Interventions DME instruction;Gait training;Stair  training;Functional mobility training;Therapeutic activities;Therapeutic exercise;Balance training   PT Goals (Current goals can be found in the Care Plan section) Acute Rehab PT Goals Patient Stated Goal: didn't state PT Goal Formulation: With patient Time For Goal Achievement: 02/28/16 Potential to Achieve Goals: Good    Frequency Min 3X/week   Barriers to discharge Decreased caregiver support unsure of avialble family support    Co-evaluation               End of Session Equipment Utilized During Treatment: Gait belt Activity Tolerance: Patient tolerated treatment well Patient left: in chair;with call bell/phone within reach;with chair alarm set Nurse Communication: Mobility status    Functional Assessment Tool Used: clinical judgement Functional Limitation: Mobility: Walking and moving around Mobility: Walking and Moving Around Current Status [id number]): At least 20 percent but less than 40 percent impaired, limited or restricted Mobility: Walking and Moving Around Goal Status [phone]): At least 1 percent but less than 20 percent impaired, limited or restricted    Time: 0750-0812 PT Time Calculation (min) (ACUTE ONLY): 22 min   Charges:   PT Evaluation $PT Eval Moderate Complexity: 1 Procedure     PT G Codes:   PT G-Codes **NOT FOR INPATIENT CLASS** Functional Assessment Tool Used: clinical judgement Functional Limitation: Mobility: Walking and moving around Mobility: Walking and Moving Around Current Status [id number]): At least 20 percent but less than 40 percent impaired, limited or restricted Mobility: Walking and Moving Around Goal Status [phone]): At least 1 percent but less than 20 percent impaired, limited or restricted    Kingsley Callander 02/21/2016, 8:47 AM   Kittie Plater, PT, DPT Pager #: [phone] Office #: [phone]

## 2016-02-21 NOTE — Care Management Note (Addendum)
Case Management Note  Patient Details  Name: [name] MRN: [id number] Date of Birth: [birth year]  Subjective/Objective:                    Action/Plan: Plan is for patient to discharge home with family. Orders are for [facility] services but Medicaid will not cover [facility] services with patients current diagnosis. CM spoke with the MD and asked about Outpatient OT. MD in agreement. CM met with the patient and her family and they also are in agreement with outpatient OT for an evaluation visit with her Medicaid. They are also interested in going to [facility]. Orders placed in EPIC and information on the AVS.  Bedside RN updated.  Expected Discharge Date:                  Expected Discharge Plan:  Home/Self Care  In-House Referral:     Discharge planning Services  CM Consult  Post Acute Care Choice:  Durable Medical Equipment Choice offered to:  Patient  DME Arranged:  3-N-1, Walker rolling DME Agency:  [place]:    [place]:     Status of Service:  Completed, signed off  Medicare Important Message Given:    Date Medicare IM Given:    Medicare IM give by:    Date Additional Medicare IM Given:    Additional Medicare Important Message give by:     If discussed at [place] of Stay Meetings, dates discussed:    Additional Comments:  Pollie Friar, RN 02/21/2016, 2:33 PM

## 2016-02-21 NOTE — Progress Notes (Signed)
Family Medicine Teaching Service Daily Progress Note Intern Pager: [phone]  Patient name: [name] Medical record number: [id number] Date of birth: 1966/04/28 Age: 50 y.o. Gender: female  Primary Care Provider: Sharon Seller, NP Consultants: None Code Status: Full  Pt Overview and Major Events to Date:  6/4: Admitted to FMTS with vertigo  Assessment and Plan: [name] is a 50 y.o. female presenting with vertigo. PMH is significant for normocytic anemia, asthma, hyperlipidemia, hx herpes zoster.  Vertigo / Weakness: Imaging negative. No cardiac events overnight. Likely BPPV. Dizziness has greatly improved this morning. Not currently dizzy - Meclizine 25mg  tid prn. Can increase to max of 100mg /day. - Zofran prn for nausea - Vestibular rehab consult. - Neuro checks every 4 hours - d/c cardiac monitoring this morning. No events overnight. - PT/OT consult for weakness  Goiter with subclinical hyperthyroidism: Symmetric thyromegaly noted on exam, without nodules. Last TSH 11/2012 was 0.202. HR in the 60s-70s. TSH 0.148 (low), Free T4 0.98 (nl) - Free T3 pending  Asthma: Occasional expiratory wheezes auscultated on lung exam, normal work of breathing. Stable on room air. - Albuterol q6hrs prn  Hyperlipidemia: Last lipid panel 11/2015: Chol 239, TG 69, HDL 52, LDL 173 - Continue home med: Pravastatin 40mg  daily  Hypertension: Pt does not have a diagnosis of HTN, but BPs to 155/73 in the ED. Improved to 133/67 this morning. - Consider addition of anti-hypertensive if BPs are persistently elevated.  Normocytic Anemia: Has a hx of menorrhagia, now s/p hysterectomy. Hgb 11.6, MCV 81 in the ED. - Monitor  Social: Pt does not have a PCP and has presented to the ED frequently.  - SW consult  FEN/GI: Heart healthy diet Prophylaxis: Lovenox  Disposition: Anticipate home likely today, pending vestibular PT recs and ability to ambulate.  Subjective:  Pt doing well  this morning. Conversation limited, as Pt's son was not present in the room. Pt states her dizziness is much better this morning.  Objective: Temp:  [97.9 F (36.6 C)-98.7 F (37.1 C)] 98.1 F (36.7 C) (06/05 0546) Pulse Rate:  [67-75] 68 (06/05 0546) Resp:  [11-22] 18 (06/05 0546) BP: (122-157)/(49-86) 133/67 mmHg (06/05 0546) SpO2:  [98 %-100 %] 100 % (06/05 0546) Weight:  [198 lb (89.812 kg)] 198 lb (89.812 kg) (06/04 2000) Physical Exam: General: Tired-appearing, sitting up in the chair eating breakfast, in NAD HEENT: [place]/AT, EOMI, MMM Neck: Symmetric thyromegaly without nodules, full ROM, no lymphadenopathy Cardiovascular: RRR, no m/r/g, 2+ DP pulses bilaterally Respiratory: CTAB, normal work of breathing on room air Abdomen: Soft, non-tender, non-distended MSK: No edema, normal tone Skin: No rashes or lesions Neuro: Awake, alert, CN 2-12 grossly intact, no focal deficits Psych: Appropriate affect, normal behavior  Laboratory:  Recent Labs Lab 02/20/16 1114  WBC 3.9*  HGB 11.6*  HCT 37.2  PLT 182    Recent Labs Lab 02/20/16 1114  NA 140  K 3.8  CL 110  CO2 27  BUN 10  CREATININE 0.78  CALCIUM 8.8*  PROT 6.3*  BILITOT 0.2*  ALKPHOS 55  ALT 17  AST 20  GLUCOSE 102*   TSH 0.145 Free T4 0.98  Imaging/Diagnostic Tests: EKG: sinus rhythm, and inferior T wave abnormalities stable/diminished from previous tracing.  CT head: negative for acute abnormalities MRI: negative for acute changes  Sela Hua, MD 02/21/2016, 7:02 AM PGY-1, [place] Intern pager: [phone], text pages welcome

## 2016-02-21 NOTE — Evaluation (Addendum)
Occupational Therapy Evaluation Patient Details Name: [name] MRN: [id number] DOB: September 17, 1966 Today's Date: 02/21/2016    History of Present Illness [name] is a 50 y.o. female admitted with vertigo. PMH is significant for normocytic anemia, asthma, hyperlipidemia, hx herpes zoster, Dysrhythmia, and arthritis of Rt shoulder. MRI negative for acute infarct.   Clinical Impression   Pt admitted with above. Pt independent with ADLs, PTA. Feel pt will benefit from acute OT to increase independence and strength prior to d/c.     Follow Up Recommendations  Home health OT;Supervision/Assistance - 24 hour    Equipment Recommendations  3 in 1 bedside comode    Recommendations for Other Services       Precautions / Restrictions Precautions Precautions: Fall Restrictions Weight Bearing Restrictions: No      Mobility Bed Mobility Overal bed mobility: Modified Independent             General bed mobility comments: for sit to sidelying  Transfers Overall transfer level: Needs assistance Transfers: Sit to/from Stand Sit to Stand: Min assist         General transfer comment: assist to steady    Balance Assist for balance upon standing from chair. Min A-Min guard for ambulation.                       ADL Overall ADL's : Needs assistance/impaired     Grooming: Applying deodorant;Set up;Supervision/safety;Standing               Lower Body Dressing: Sit to/from stand;Minimal assistance   Toilet Transfer: Ambulation (Min A-sit to stand and Min A-Min guard for ambulation)           Functional mobility during ADLs: Minimal assistance (Min A-sit to stand; Min A-Min guard for ambulation)       Vision     Perception     Praxis      Pertinent Vitals/Pain Pain Assessment: No/denies pain     Hand Dominance   Extremity/Trunk Assessment Upper Extremity Assessment Upper Extremity Assessment:  (weakness in bilateral shoulder  flexors)   Lower Extremity Assessment Lower Extremity Assessment: Defer to PT evaluation   Cervical / Trunk Assessment Cervical / Trunk Assessment: Normal   Communication Communication Communication: Prefers language other than English   Cognition Arousal/Alertness: Lethargic Behavior During Therapy: Flat affect Overall Cognitive Status: No family/caregiver present to determine baseline cognitive functioning (disoriented to year and decreased short term memory; cue for how to apply deodorant); difficult to assess due to language barrier                     General Comments       Exercises       Shoulder Instructions      Home Living Family/patient expects to be discharged to:: Private residence Living Arrangements: Children Available Help at Discharge: Family;Available PRN/intermittently Type of Home: Apartment Home Access: Stairs to enter Entrance Stairs-Number of Steps: flight Entrance Stairs-Rails: Right Home Layout: One level     Bathroom Shower/Tub: Walk-in shower                    Prior Functioning/Environment Level of Independence: Independent           OT Diagnosis: Generalized weakness   OT Problem List: Decreased cognition;Impaired balance (sitting and/or standing);Decreased activity tolerance;Decreased strength;Decreased knowledge of use of DME or AE;Decreased knowledge of precautions;Obesity   OT Treatment/Interventions: Self-care/ADL training;DME and/or AE instruction;Therapeutic activities;Patient/family education;Balance training;Therapeutic  exercise;Cognitive remediation/compensation    OT Goals(Current goals can be found in the care plan section) Acute Rehab OT Goals Patient Stated Goal: not stated OT Goal Formulation: With patient Time For Goal Achievement: 02/28/16 Potential to Achieve Goals: Good ADL Goals Pt Will Perform Lower Body Bathing: sit to/from stand;with supervision Pt Will Perform Lower Body Dressing: with  supervision;sit to/from stand Pt Will Transfer to Toilet: with supervision;ambulating (with RW) Pt Will Perform Toileting - Clothing Manipulation and hygiene: sit to/from stand;with modified independence Pt Will Perform Tub/Shower Transfer: Shower transfer;with supervision;ambulating;rolling walker;3 in 1;with set-up Additional ADL Goal #1: Pt will independently perform HEP for bilateral UEs to increase strength.  OT Frequency: Min 2X/week   Barriers to D/C:            Co-evaluation              End of Session Equipment Utilized During Treatment: Gait belt  Activity Tolerance: Other (comment) (dizzy) Patient left: in bed;with call bell/phone within reach;with bed alarm set   Time: [id number] OT Time Calculation (min): 15 min Charges:  OT General Charges $OT Visit: 1 Procedure OT Evaluation $OT Eval Moderate Complexity: 1 Procedure G-Codes: OT G-codes **NOT FOR INPATIENT CLASS** Functional Assessment Tool Used: clinical judgment Functional Limitation: Self care Self Care Current Status [id number]): At least 20 percent but less than 40 percent impaired, limited or restricted Self Care Goal Status [id number]): At least 1 percent but less than 20 percent impaired, limited or restricted  Benito Mccreedy OTR/L [id number] 02/21/2016, 10:10 AM

## 2016-02-21 NOTE — Progress Notes (Signed)
Discharge orders received.  Discharge instructions and follow-up appointments reviewed with the patient.  VSS upon discharge.  IV removed and education complete.  All belongings sent with the patient.  Transported out via wheelchair. Edona Schreffler M, RN   

## 2016-02-21 NOTE — Discharge Summary (Signed)
[facility] Discharge Summary  Patient name: [name] Medical record number: [id number] Date of birth: 1966/08/08 Age: 50 y.o. Gender: female Date of Admission: 02/20/2016  Date of Discharge: 02/21/16 Admitting Physician: Lind Covert, MD  Primary Care Provider: Sharon Seller, NP Consultants: None  Indication for Hospitalization: Vertigo, inability to walk  Discharge Diagnoses/Problem List:  Vertigo Hyperlipidemia Goiter with subclinical hyperthyroidism  Asthma  Disposition: Home with HHOT and rolling walker  Discharge Condition: Stable, improved  Discharge Exam:  General: Tired-appearing, sitting up in the chair eating breakfast, in NAD HEENT: [place]/AT, EOMI, MMM Neck: Symmetric thyromegaly without nodules, full ROM, no lymphadenopathy Cardiovascular: RRR, no m/r/g, 2+ DP pulses bilaterally Respiratory: CTAB, normal work of breathing on room air Abdomen: Soft, non-tender, non-distended MSK: No edema, normal tone Skin: No rashes or lesions Neuro: Awake, alert, CN 2-12 grossly intact, no focal deficits Psych: Appropriate affect, normal behavior  Brief Hospital Course:  [name] is a 50 year old female with a PMH of hyperlipidemia, anemia, and asthma who presented to the ED with "feeling like the room is spinning", starting two days prior. In the ED, she was unable to ambulate without dizziness. She was given Meclizine x 1 and Valium x 1 with no improvement in symptoms. CT head and MRI brain were negative for acute abnormalities. She was admitted for observation. We gave her an additional 50mg  of Meclizine on the night of admission. The next morning, her symptoms had completely resolved. She was evaluated by physical therapy and occupational therapy. She was able to get through her entire PT session without any complaints of dizziness. She ambulated 100 feet. Occupational therapy recommended home health OT and 3 in 1 bedside  commode.   During her hospitalization, she was noted to have a symmetric goiter without nodules. We checked a TSH, which was low. We then checked T3 and free T4. Free T4 was normal. T3 was pending at the time of discharge.   Issues for Follow Up:  1. Pt's acute vertigo resolved in the hospital with Meclizine. If she continues to experience episodes of vertigo as an outpatient, can consider referring her to vestibular PT. 2. Pt was noted to have subclinical hyperthyroidism while hospitalized. Please continue to follow this as an outpatient.  Significant Procedures: None  Significant Labs and Imaging:   Recent Labs Lab 02/20/16 1114  WBC 3.9*  HGB 11.6*  HCT 37.2  PLT 182    Recent Labs Lab 02/20/16 1114  NA 140  K 3.8  CL 110  CO2 27  GLUCOSE 102*  BUN 10  CREATININE 0.78  CALCIUM 8.8*  ALKPHOS 55  AST 20  ALT 17  ALBUMIN 3.5   TSH 0.145 Free T4 0.98  EKG: sinus rhythm, and inferior T wave abnormalities stable/diminished from previous tracing.  CT head: negative for acute abnormalities MRI: negative for acute changes  Results/Tests Pending at Time of Discharge: T3  Discharge Medications:    Medication List    STOP taking these medications        acyclovir 800 MG tablet  Commonly known as:  ZOVIRAX     lidocaine 5 %  Commonly known as:  LIDODERM     naproxen 500 MG tablet  Commonly known as:  NAPROSYN     predniSONE 20 MG tablet  Commonly known as:  DELTASONE      TAKE these medications        albuterol 108 (90 Base) MCG/ACT inhaler  Commonly known as:  PROVENTIL HFA;VENTOLIN HFA  Inhale 1-2 puffs into the lungs every 6 (six) hours as needed for wheezing.     ibuprofen 800 MG tablet  Commonly known as:  ADVIL,MOTRIN  Take 1 tablet (800 mg total) by mouth 3 (three) times daily.     meclizine 25 MG tablet  Commonly known as:  ANTIVERT  Take 0.5 tablets (12.5 mg total) by mouth 2 (two) times daily as needed for dizziness.     pravastatin 40 MG  tablet  Commonly known as:  PRAVACHOL  Take 1 tablet (40 mg total) by mouth daily.        Discharge Instructions: Please refer to Patient Instructions section of EMR for full details.  Patient was counseled important signs and symptoms that should prompt return to medical care, changes in medications, dietary instructions, activity restrictions, and follow up appointments.   Follow-Up Appointments:   Sela Hua, MD 02/21/2016, 2:20 PM PGY-1, [place]

## 2016-02-21 NOTE — Care Management Note (Signed)
Case Management Note  Patient Details  Name: [name] MRN: [id number] Date of Birth: 02-07-1966  Subjective/Objective:                    Action/Plan: Plan is for patient to discharge to home. Orders for walker and 3 in 1. Jermaine with [facility] DME notified and will deliver the equipment to the room. Will update the bedside RN.   Expected Discharge Date:                  Expected Discharge Plan:  Home/Self Care  In-House Referral:     Discharge planning Services  CM Consult  Post Acute Care Choice:  Durable Medical Equipment Choice offered to:  Patient  DME Arranged:  3-N-1, Walker rolling DME Agency:  [place]:    [place] Agency:     Status of Service:  In process, will continue to follow  Medicare Important Message Given:    Date Medicare IM Given:    Medicare IM give by:    Date Additional Medicare IM Given:    Additional Medicare Important Message give by:     If discussed at [place] of Stay Meetings, dates discussed:    Additional Comments:  Pollie Friar, RN 02/21/2016, 1:13 PM

## 2016-02-21 NOTE — Discharge Instructions (Signed)
You were hospitalized because you were having dizziness that made it hard to walk. We gave you some medications and your dizziness improved. We also had our physical therapist and occupational therapist evaluate you. The physical therapist recommended a walker to use at home until you regain your strength. The occupational therapist recommended home health occupational therapy. I have put in an order to have someone come out to your house to work with you more, so that you can regain your strength.  It was a pleasure taking care of you this hospitalization!

## 2016-02-22 LAB — T3: T3 TOTAL: 110 ng/dL (ref 71–180)

## 2016-03-24 ENCOUNTER — Ambulatory Visit
Payer: No Typology Code available for payment source | Attending: Family Medicine | Admitting: Rehabilitative and Restorative Service Providers"

## 2016-06-16 ENCOUNTER — Ambulatory Visit: Payer: No Typology Code available for payment source | Admitting: Family Medicine

## 2016-06-30 ENCOUNTER — Encounter (HOSPITAL_COMMUNITY): Payer: Self-pay | Admitting: Emergency Medicine

## 2016-06-30 DIAGNOSIS — R0789 Other chest pain: Secondary | ICD-10-CM | POA: Insufficient documentation

## 2016-06-30 DIAGNOSIS — M545 Low back pain: Secondary | ICD-10-CM | POA: Insufficient documentation

## 2016-06-30 DIAGNOSIS — J45909 Unspecified asthma, uncomplicated: Secondary | ICD-10-CM | POA: Insufficient documentation

## 2016-06-30 LAB — BASIC METABOLIC PANEL
ANION GAP: 5 (ref 5–15)
BUN: 10 mg/dL (ref 6–20)
CALCIUM: 8.9 mg/dL (ref 8.9–10.3)
CO2: 30 mmol/L (ref 22–32)
Chloride: 107 mmol/L (ref 101–111)
Creatinine, Ser: 0.88 mg/dL (ref 0.44–1.00)
Glucose, Bld: 96 mg/dL (ref 65–99)
POTASSIUM: 4.5 mmol/L (ref 3.5–5.1)
SODIUM: 142 mmol/L (ref 135–145)

## 2016-06-30 LAB — URINE MICROSCOPIC-ADD ON: WBC, UA: NONE SEEN WBC/hpf (ref 0–5)

## 2016-06-30 LAB — URINALYSIS, ROUTINE W REFLEX MICROSCOPIC
BILIRUBIN URINE: NEGATIVE
Glucose, UA: NEGATIVE mg/dL
KETONES UR: NEGATIVE mg/dL
Leukocytes, UA: NEGATIVE
Nitrite: NEGATIVE
PROTEIN: NEGATIVE mg/dL
Specific Gravity, Urine: 1.025 (ref 1.005–1.030)
pH: 6 (ref 5.0–8.0)

## 2016-06-30 LAB — CBC WITH DIFFERENTIAL/PLATELET
BASOS ABS: 0 10*3/uL (ref 0.0–0.1)
BASOS PCT: 0 %
EOS ABS: 0 10*3/uL (ref 0.0–0.7)
EOS PCT: 1 %
HCT: 36.6 % (ref 36.0–46.0)
Hemoglobin: 11.5 g/dL — ABNORMAL LOW (ref 12.0–15.0)
LYMPHS PCT: 46 %
Lymphs Abs: 2 10*3/uL (ref 0.7–4.0)
MCH: 25.5 pg — ABNORMAL LOW (ref 26.0–34.0)
MCHC: 31.4 g/dL (ref 30.0–36.0)
MCV: 81.2 fL (ref 78.0–100.0)
Monocytes Absolute: 0.4 10*3/uL (ref 0.1–1.0)
Monocytes Relative: 9 %
Neutro Abs: 1.9 10*3/uL (ref 1.7–7.7)
Neutrophils Relative %: 44 %
PLATELETS: 175 10*3/uL (ref 150–400)
RBC: 4.51 MIL/uL (ref 3.87–5.11)
RDW: 13.8 % (ref 11.5–15.5)
WBC: 4.2 10*3/uL (ref 4.0–10.5)

## 2016-06-30 NOTE — ED Triage Notes (Signed)
Pt. reports chronic low back pain for several years , pain worsened this past several days , denies injury or urinary symptoms , pain increases with movement , no fever or chills.

## 2016-07-01 ENCOUNTER — Emergency Department (HOSPITAL_COMMUNITY): Payer: Self-pay

## 2016-07-01 ENCOUNTER — Emergency Department (HOSPITAL_COMMUNITY)
Admission: EM | Admit: 2016-07-01 | Discharge: 2016-07-01 | Disposition: A | Discharge status: Home / Self Care | Payer: Self-pay | Attending: Emergency Medicine | Admitting: Emergency Medicine

## 2016-07-01 DIAGNOSIS — R0789 Other chest pain: Secondary | ICD-10-CM

## 2016-07-01 DIAGNOSIS — M545 Low back pain, unspecified: Secondary | ICD-10-CM

## 2016-07-01 LAB — I-STAT TROPONIN, ED: Troponin i, poc: 0.01 ng/mL (ref 0.00–0.08)

## 2016-07-01 MED ORDER — CYCLOBENZAPRINE HCL 10 MG PO TABS: 10.0000 mg | ORAL_TABLET | Freq: Two times a day (BID) | ORAL | 0 refills | Status: DC | PRN

## 2016-07-01 MED ORDER — FENTANYL CITRATE (PF) 100 MCG/2ML IJ SOLN
50.0000 ug | Freq: Once | INTRAMUSCULAR | Status: AC
  Administered 2016-07-01: 50 ug via INTRAVENOUS
  Filled 2016-07-01: qty 2

## 2016-07-01 MED ORDER — TRAMADOL HCL 50 MG PO TABS: 50.0000 mg | ORAL_TABLET | Freq: Four times a day (QID) | ORAL | 0 refills | Status: DC | PRN

## 2016-07-01 MED ORDER — KETOROLAC TROMETHAMINE 30 MG/ML IJ SOLN
30.0000 mg | Freq: Once | INTRAMUSCULAR | Status: AC
  Administered 2016-07-01: 30 mg via INTRAVENOUS
  Filled 2016-07-01: qty 1

## 2016-07-01 MED ORDER — DEXAMETHASONE SODIUM PHOSPHATE 10 MG/ML IJ SOLN
10.0000 mg | Freq: Once | INTRAMUSCULAR | Status: AC
Start: 2016-07-01 — End: 2016-07-01
  Administered 2016-07-01: 10 mg via INTRAVENOUS
  Filled 2016-07-01: qty 1

## 2016-07-01 MED ORDER — PREDNISONE 20 MG PO TABS: 40.0000 mg | ORAL_TABLET | Freq: Every day | ORAL | 0 refills | Status: DC

## 2016-07-01 NOTE — ED Notes (Signed)
PA-C at bedside 

## 2016-07-01 NOTE — ED Provider Notes (Signed)
[facility] DEPT Provider Note   CSN: [id number] Arrival date & time: 06/30/16  2119     History   Chief Complaint Chief Complaint  Patient presents with  . Back Pain    HPI [name] is a 50 y.o. female.  HPI   Patient is a 50 year old female with chronic back pain, arthritis, asthma, she presents to the emergency department complaining of 3 days of bilateral low back pain flare without any inciting injury or strain. Low back pain does not radiate and she denies any weakness or numbness in her legs. She also complains of one day of central chest pain that is sharp and stabbing, rated 10 out of 10, worse with any movement of her torso or with breathing or speaking.  She denies any cough, shortness of breath, fever, chills, wheeze, sweats.  She states it is not consistent with her asthma.  She denies saddle anesthesia, history of IV drug use, chronic steroid use, or cancer history.  Past Medical History:  Diagnosis Date  . Arthritis    rt shoulder  . Asthma   . Dysrhythmia    sometimes on exertion  . Headache(784.0)   . Shortness of breath    on exertion    Patient Active Problem List   Diagnosis Date Noted  . Hyperlipidemia   . Asthma, mild intermittent   . Subclinical hyperthyroidism   . Vertigo 02/20/2016  . Anemia 12/05/2012  . PNA (pneumonia) 12/04/2012  . Menorrhagia 12/04/2012    Past Surgical History:  Procedure Laterality Date  . ABDOMINAL HYSTERECTOMY Bilateral 05/15/2013   Procedure: HYSTERECTOMY ABDOMINAL WITH BILATERAL SALPINGECTOMY;  Surgeon: Lavonia Drafts, MD;  Location: [facility];  Service: Gynecology;  Laterality: Bilateral;  . CESAREAN SECTION  1995  . ROBOTIC ASSISTED TOTAL HYSTERECTOMY N/A 05/15/2013   Procedure: ATTEMPTED ROBOTIC TOTAL HYSTERECTOMY;  Surgeon: Lavonia Drafts, MD;  Location: [facility];  Service: Gynecology;  Laterality: N/A;    OB History    Gravida Para Term Preterm AB Living   11 5 1 4 6 5    SAB TAB  Ectopic Multiple Live Births   6               Home Medications    Prior to Admission medications   Medication Sig Start Date End Date Taking? Authorizing Provider  albuterol (PROVENTIL HFA;VENTOLIN HFA) 108 (90 Base) MCG/ACT inhaler Inhale 1-2 puffs into the lungs every 6 (six) hours as needed for wheezing. 12/10/15   Micheline Chapman, NP  cyclobenzaprine (FLEXERIL) 10 MG tablet Take 1 tablet (10 mg total) by mouth 2 (two) times daily as needed for muscle spasms. 07/01/16   Delsa Grana, PA-C  ibuprofen (ADVIL,MOTRIN) 800 MG tablet Take 1 tablet (800 mg total) by mouth 3 (three) times daily. 10/16/15   Shawn C Joy, PA-C  meclizine (ANTIVERT) 25 MG tablet Take 0.5 tablets (12.5 mg total) by mouth 2 (two) times daily as needed for dizziness. 02/21/16   Sela Hua, MD  pravastatin (PRAVACHOL) 40 MG tablet Take 1 tablet (40 mg total) by mouth daily. Patient not taking: Reported on 02/20/2016 12/13/15   Micheline Chapman, NP  predniSONE (DELTASONE) 20 MG tablet Take 2 tablets (40 mg total) by mouth daily. 07/01/16   Delsa Grana, PA-C  traMADol (ULTRAM) 50 MG tablet Take 1 tablet (50 mg total) by mouth every 6 (six) hours as needed. 07/01/16   Delsa Grana, PA-C    Family History No family history on file.  Social History Social  History  Substance Use Topics  . Smoking status: Never Smoker  . Smokeless tobacco: Never Used  . Alcohol use No     Allergies   Review of patient's allergies indicates no known allergies.   Review of Systems Review of Systems  All other systems reviewed and are negative.    Physical Exam Updated Vital Signs BP 107/64   Pulse 63   Temp 97.7 F (36.5 C) (Oral)   Resp 11   LMP 07/23/2012   SpO2 97%   Physical Exam  Constitutional: She is oriented to person, place, and time. She appears well-developed and well-nourished. No distress.  HENT:  Head: Normocephalic and atraumatic.  Right Ear: External ear normal.  Left Ear: External ear normal.    Nose: Nose normal.  Mouth/Throat: Oropharynx is clear and moist.  Eyes: Conjunctivae and EOM are normal. Pupils are equal, round, and reactive to light. Right eye exhibits no discharge. Left eye exhibits no discharge. No scleral icterus.  Neck: Normal range of motion. Neck supple.  Cardiovascular: Normal rate, regular rhythm, normal heart sounds and intact distal pulses.  Exam reveals no gallop and no friction rub.   No murmur heard. Pulmonary/Chest: Effort normal and breath sounds normal. No stridor. No respiratory distress. She has no wheezes. She has no rales. She exhibits tenderness.  Anterior chest wall below sternum and xiphoid process tenderness palpation  Abdominal: Soft. Bowel sounds are normal. She exhibits no distension and no mass. There is no tenderness. There is no rebound and no guarding.  Musculoskeletal: Normal range of motion. She exhibits tenderness. She exhibits no edema or deformity.  Generalized tenderness to palpation of the low thoracic paraspinal muscles and lumbar paraspinal muscles, no midline tenderness, no step-off  Neurological: She is alert and oriented to person, place, and time. She exhibits normal muscle tone. Coordination normal.  Normal sensation to light touch bilaterally in lower extremities, 5/5 percent flexion and plantar flexion bilaterally, antalgic gait  Skin: Skin is warm and dry. Capillary refill takes less than 2 seconds. No rash noted. She is not diaphoretic. No erythema. No pallor.  Psychiatric: She has a normal mood and affect. Her behavior is normal. Judgment and thought content normal.  Nursing note and vitals reviewed.    ED Treatments / Results  Labs (all labs ordered are listed, but only abnormal results are displayed) Labs Reviewed  URINALYSIS, [place] (NOT AT [facility]) - Abnormal; Notable for the following:       Result Value   Hgb urine dipstick MODERATE (*)    All other components within normal limits  CBC WITH  DIFFERENTIAL/PLATELET - Abnormal; Notable for the following:    Hemoglobin 11.5 (*)    MCH 25.5 (*)    All other components within normal limits  URINE MICROSCOPIC-ADD ON - Abnormal; Notable for the following:    Squamous Epithelial / LPF 0-5 (*)    Bacteria, UA RARE (*)    All other components within normal limits  BASIC METABOLIC PANEL  I-STAT TROPOININ, ED    EKG  EKG Interpretation None       Radiology Dg Chest 2 View  Result Date: 07/01/2016 CLINICAL DATA:  Chest pain. EXAM: CHEST  2 VIEW COMPARISON:  Radiograph 09/30/2015.  Chest CT 03/03/2008 FINDINGS: Mild rightward tracheal deviation secondary to enlarged left lobe of the thyroid gland on prior CT. Mild cardiomegaly is unchanged. Unchanged mediastinal contours. There is mild hyperinflation. Scattered calcified granulomas. No focal airspace disease, pleural effusion or pneumothorax.  Mild degenerative change in the thoracic spine. IMPRESSION: Stable chronic change without acute abnormality. Electronically Signed   By: Jeb Levering M.D.   On: 07/01/2016 02:38    Procedures Procedures (including critical care time)  Medications Ordered in ED Medications  fentaNYL (SUBLIMAZE) injection 50 mcg (50 mcg Intravenous Given 07/01/16 0222)  dexamethasone (DECADRON) injection 10 mg (10 mg Intravenous Given 07/01/16 0224)  ketorolac (TORADOL) 30 MG/ML injection 30 mg (30 mg Intravenous Given 07/01/16 0224)     Initial Impression / Assessment and Plan / ED Course  I have reviewed the triage vital signs and the nursing notes.  Pertinent labs & imaging results that were available during my care of the patient were reviewed by me and considered in my medical decision making (see chart for details).  Clinical Course  Patient with acute on chronic bilateral low back pain 3 days, also complains of one day of central chest wall pain which is worse with palpation, movement and speaking or breathing, to produce full on exam with  palpation.  No neurological deficits and normal neuro exam.  Patient can walk but states is painful.  No loss of bowel or bladder control.  No concern for cauda equina.  No fever, night sweats, weight loss, h/o cancer, IVDU.  RICE protocol and pain medicine indicated and discussed with patient.  Chest pain is likely muscle skeletal chest wall pain, Patient is to be discharged with recommendation to follow up with PCP in regards to today's hospital visit. Chest pain is not likely of cardiac or pulmonary etiology d/t presentation, perc negative, VSS, no tracheal deviation, no JVD or new murmur, RRR, breath sounds equal bilaterally, EKG without acute abnormalities, negative troponin, and negative CXR. Pt has been advised to return to the ED is CP becomes exertional, associated with diaphoresis or nausea, radiates to left jaw/arm, worsens or becomes concerning in any way. Pt appears reliable for follow up and is agreeable to discharge.   Patient's pain was controlled in the ER and she was discharged home with a steroid burst, muscle relaxers and pain medications.  She recently missed an appointment as [facility] clinic where she was supposed to get established with a new patient, she will call to reschedule, as she was originally going there after a visit for chronic back pain.  Final Clinical Impressions(s) / ED Diagnoses   Final diagnoses:  Bilateral low back pain without sciatica, unspecified chronicity  Chest wall pain    New Prescriptions Discharge Medication List as of 07/01/2016  3:38 AM    START taking these medications   Details  cyclobenzaprine (FLEXERIL) 10 MG tablet Take 1 tablet (10 mg total) by mouth 2 (two) times daily as needed for muscle spasms., Starting Sat 07/01/2016, Print    predniSONE (DELTASONE) 20 MG tablet Take 2 tablets (40 mg total) by mouth daily., Starting Sat 07/01/2016, Print    traMADol (ULTRAM) 50 MG tablet Take 1 tablet (50 mg total) by mouth every 6 (six) hours  as needed., Starting Sat 07/01/2016, Print         Delsa Grana, PA-C 07/01/16 [id number]    Veryl Speak, MD 07/02/16 [phone]

## 2016-07-07 ENCOUNTER — Encounter (HOSPITAL_COMMUNITY): Payer: Self-pay | Admitting: Nurse Practitioner

## 2016-07-07 ENCOUNTER — Encounter (HOSPITAL_COMMUNITY): Payer: Self-pay | Admitting: *Deleted

## 2016-07-07 ENCOUNTER — Ambulatory Visit (HOSPITAL_COMMUNITY)
Admission: EM | Admit: 2016-07-07 | Discharge: 2016-07-07 | Disposition: A | Discharge status: Nursing Home (Includes ICF) | Payer: Medicaid Other | Attending: Family Medicine | Admitting: Family Medicine

## 2016-07-07 ENCOUNTER — Emergency Department (HOSPITAL_COMMUNITY)
Admission: EM | Admit: 2016-07-07 | Discharge: 2016-07-07 | Disposition: A | Discharge status: Home / Self Care | Payer: Medicaid Other | Attending: Emergency Medicine | Admitting: Emergency Medicine

## 2016-07-07 DIAGNOSIS — S29012A Strain of muscle and tendon of back wall of thorax, initial encounter: Secondary | ICD-10-CM | POA: Insufficient documentation

## 2016-07-07 DIAGNOSIS — M62830 Muscle spasm of back: Secondary | ICD-10-CM

## 2016-07-07 DIAGNOSIS — Y929 Unspecified place or not applicable: Secondary | ICD-10-CM | POA: Insufficient documentation

## 2016-07-07 DIAGNOSIS — X58XXXA Exposure to other specified factors, initial encounter: Secondary | ICD-10-CM | POA: Insufficient documentation

## 2016-07-07 DIAGNOSIS — Y939 Activity, unspecified: Secondary | ICD-10-CM | POA: Insufficient documentation

## 2016-07-07 DIAGNOSIS — J45909 Unspecified asthma, uncomplicated: Secondary | ICD-10-CM | POA: Insufficient documentation

## 2016-07-07 DIAGNOSIS — Y999 Unspecified external cause status: Secondary | ICD-10-CM | POA: Insufficient documentation

## 2016-07-07 DIAGNOSIS — T148XXA Other injury of unspecified body region, initial encounter: Secondary | ICD-10-CM

## 2016-07-07 MED ORDER — HYDROCODONE-ACETAMINOPHEN 5-325 MG PO TABS: 1.0000 | ORAL_TABLET | Freq: Four times a day (QID) | ORAL | 0 refills | Status: DC | PRN

## 2016-07-07 MED ORDER — PREDNISONE 50 MG PO TABS: 50.0000 mg | ORAL_TABLET | Freq: Every day | ORAL | 0 refills | Status: DC

## 2016-07-07 NOTE — Discharge Instructions (Signed)
Return here as needed.  Use ice and heat on your back.  Follow-up with your primary care doctor

## 2016-07-07 NOTE — ED Provider Notes (Signed)
[facility] DEPT Provider Note  By signing my name below, I, Bea Graff, attest that this documentation has been prepared under the direction and in the presence of [employer], PA-C. Electronically Signed: Bea Graff, ED Scribe. 07/07/16. 2:05 PM.    History   Chief Complaint Chief Complaint  Patient presents with  . Back Pain    The history is provided by the patient and medical records. No language interpreter was used.    HPI Comments:  [name] is an obese 50 y.o. female with PMHx of arthritis who presents to the Emergency Department complaining of mid back pain that began about three weeks ago. Pt was seen here one week ago and prescribed Prednisone, Flexeril and Tramadol which she reports taking without significant relief. She presented to the [facility] today and was referred here for evaluation. Pt's friend is with her and states the pt has been laying around and not using ice and heat therapy as previously directed. Twisting increases her pain. She denies alleviating factors. She denies fever, chills, nausea, vomiting, numbness, tingling or weakness of the lower extremities, bruising, wounds, fall, trauma, injury, saddle anesthesia, bowel or bladder incontinence, dysuria, hematuria, cough or SOB. She is ambulatory without assistance.  Past Medical History:  Diagnosis Date  . Arthritis    rt shoulder  . Asthma   . Dysrhythmia    sometimes on exertion  . Headache(784.0)   . Shortness of breath    on exertion    Patient Active Problem List   Diagnosis Date Noted  . Hyperlipidemia   . Asthma, mild intermittent   . Subclinical hyperthyroidism   . Vertigo 02/20/2016  . Anemia 12/05/2012  . PNA (pneumonia) 12/04/2012  . Menorrhagia 12/04/2012    Past Surgical History:  Procedure Laterality Date  . ABDOMINAL HYSTERECTOMY Bilateral 05/15/2013   Procedure: HYSTERECTOMY ABDOMINAL WITH BILATERAL SALPINGECTOMY;  Surgeon: Lavonia Drafts, MD;   Location: [facility];  Service: Gynecology;  Laterality: Bilateral;  . CESAREAN SECTION  1995  . ROBOTIC ASSISTED TOTAL HYSTERECTOMY N/A 05/15/2013   Procedure: ATTEMPTED ROBOTIC TOTAL HYSTERECTOMY;  Surgeon: Lavonia Drafts, MD;  Location: [facility];  Service: Gynecology;  Laterality: N/A;    OB History    Gravida Para Term Preterm AB Living   11 5 1 4 6 5    SAB TAB Ectopic Multiple Live Births   6               Home Medications    Prior to Admission medications   Medication Sig Start Date End Date Taking? Authorizing Provider  albuterol (PROVENTIL HFA;VENTOLIN HFA) 108 (90 Base) MCG/ACT inhaler Inhale 1-2 puffs into the lungs every 6 (six) hours as needed for wheezing. 12/10/15   Micheline Chapman, NP  cyclobenzaprine (FLEXERIL) 10 MG tablet Take 1 tablet (10 mg total) by mouth 2 (two) times daily as needed for muscle spasms. 07/01/16   Delsa Grana, PA-C  ibuprofen (ADVIL,MOTRIN) 800 MG tablet Take 1 tablet (800 mg total) by mouth 3 (three) times daily. 10/16/15   Shawn C Joy, PA-C  meclizine (ANTIVERT) 25 MG tablet Take 0.5 tablets (12.5 mg total) by mouth 2 (two) times daily as needed for dizziness. 02/21/16   Sela Hua, MD  pravastatin (PRAVACHOL) 40 MG tablet Take 1 tablet (40 mg total) by mouth daily. Patient not taking: Reported on 02/20/2016 12/13/15   Micheline Chapman, NP  predniSONE (DELTASONE) 20 MG tablet Take 2 tablets (40 mg total) by mouth daily. 07/01/16   Delsa Grana,  PA-C  traMADol (ULTRAM) 50 MG tablet Take 1 tablet (50 mg total) by mouth every 6 (six) hours as needed. 07/01/16   Delsa Grana, PA-C    Family History History reviewed. No pertinent family history.  Social History Social History  Substance Use Topics  . Smoking status: Never Smoker  . Smokeless tobacco: Never Used  . Alcohol use No     Allergies   Review of patient's allergies indicates no known allergies.   Review of Systems Review of Systems  Constitutional: Negative for chills and  fever.  Respiratory: Negative for cough and shortness of breath.   Gastrointestinal: Negative for nausea and vomiting.  Genitourinary: Negative for dysuria and hematuria.       No bowel or bladder incontinence  Musculoskeletal: Positive for back pain.  Skin: Negative for color change and wound.  Neurological: Negative for weakness and numbness.     Physical Exam Updated Vital Signs BP 120/65 (BP Location: Right Arm)   Pulse 79   Temp 98 F (36.7 C) (Oral)   Resp 14   LMP 07/23/2012   SpO2 97%   Physical Exam  Constitutional: She is oriented to person, place, and time. She appears well-developed and well-nourished.  HENT:  Head: Normocephalic and atraumatic.  Neck: Normal range of motion.  Cardiovascular: Normal rate.   Pulmonary/Chest: Effort normal.  Musculoskeletal: Normal range of motion. She exhibits tenderness. She exhibits no edema or deformity.  Right sided lateral thoracic tenderness to palpation.  Neurological: She is alert and oriented to person, place, and time.  Normal gait. Normal strength. Sensations intact.  Skin: Skin is warm and dry.  Psychiatric: She has a normal mood and affect. Her behavior is normal.  Nursing note and vitals reviewed.    ED Treatments / Results  DIAGNOSTIC STUDIES: Oxygen Saturation is 97% on RA, normal by my interpretation.   COORDINATION OF CARE: 1:41 PM- Pt verbalizes understanding and agrees to plan.  Medications - No data to display   Labs (all labs ordered are listed, but only abnormal results are displayed) Labs Reviewed - No data to display  EKG  EKG Interpretation None       Radiology No results found.  Procedures Procedures (including critical care time)  Medications Ordered in ED Medications - No data to display   Initial Impression / Assessment and Plan / ED Course  I have reviewed the triage vital signs and the nursing notes.  Pertinent labs & imaging results that were available during my care  of the patient were reviewed by me and considered in my medical decision making (see chart for details).  Clinical Course    Patient with back pain.  No neurological deficits and normal neuro exam.  Patient is ambulatory.  No loss of bowel or bladder control.  No concern for cauda equina.  No fever, night sweats, weight loss, h/o cancer, IVDA, no recent procedure to back. No urinary symptoms suggestive of UTI.  Supportive care and return precaution discussed. Appears safe for discharge at this time. Follow up as indicated in discharge paperwork.    Final Clinical Impressions(s) / ED Diagnoses   Final diagnoses:  None  I personally performed the services described in this documentation, which was scribed in my presence. The recorded information has been reviewed and is accurate.  New Prescriptions New Prescriptions   No medications on file     Dalia Heading, PA-C 07/11/16 [place], MD 07/12/16 [phone]

## 2016-07-07 NOTE — ED Triage Notes (Signed)
Pt presents with c/o mid back pain. She reports onset of pain 3 weeks ago. She denies any noted activity at onset. She has had intermittent back pain in the past which resolved with no treatment. She was seen here on 10/14 for this episode of pain and discharged home with pain medication which she has been taking with no relief. She went back to [facility] today for the pain and they referred her back to ED for further evaluation. She denies n/v, abd pain, bowel/bladder changes. She is alert and breathing easily, ambulatory.

## 2016-07-07 NOTE — ED Provider Notes (Signed)
[facility]    CSN: [id number] Arrival date & time: 07/07/16  1053     History   Chief Complaint Chief Complaint  Patient presents with  . Back Pain    HPI [name] is a 50 y.o. female.   The history is provided by the patient.  Back Pain  Location:  Lumbar spine Radiates to: upper abd. bilat. Pain severity:  Moderate Onset quality:  Gradual Duration:  6 days Chronicity:  New Relieved by:  Nothing Worsened by:  Movement Associated symptoms: abdominal pain   Associated symptoms: no bladder incontinence, no bowel incontinence and no fever     Past Medical History:  Diagnosis Date  . Arthritis    rt shoulder  . Asthma   . Dysrhythmia    sometimes on exertion  . Headache(784.0)   . Shortness of breath    on exertion    Patient Active Problem List   Diagnosis Date Noted  . Hyperlipidemia   . Asthma, mild intermittent   . Subclinical hyperthyroidism   . Vertigo 02/20/2016  . Anemia 12/05/2012  . PNA (pneumonia) 12/04/2012  . Menorrhagia 12/04/2012    Past Surgical History:  Procedure Laterality Date  . ABDOMINAL HYSTERECTOMY Bilateral 05/15/2013   Procedure: HYSTERECTOMY ABDOMINAL WITH BILATERAL SALPINGECTOMY;  Surgeon: Lavonia Drafts, MD;  Location: [facility];  Service: Gynecology;  Laterality: Bilateral;  . CESAREAN SECTION  1995  . ROBOTIC ASSISTED TOTAL HYSTERECTOMY N/A 05/15/2013   Procedure: ATTEMPTED ROBOTIC TOTAL HYSTERECTOMY;  Surgeon: Lavonia Drafts, MD;  Location: [facility];  Service: Gynecology;  Laterality: N/A;    OB History    Gravida Para Term Preterm AB Living   11 5 1 4 6 5    SAB TAB Ectopic Multiple Live Births   6               Home Medications    Prior to Admission medications   Medication Sig Start Date End Date Taking? Authorizing Provider  albuterol (PROVENTIL HFA;VENTOLIN HFA) 108 (90 Base) MCG/ACT inhaler Inhale 1-2 puffs into the lungs every 6 (six) hours as needed for wheezing. 12/10/15    Micheline Chapman, NP  cyclobenzaprine (FLEXERIL) 10 MG tablet Take 1 tablet (10 mg total) by mouth 2 (two) times daily as needed for muscle spasms. 07/01/16   Delsa Grana, PA-C  ibuprofen (ADVIL,MOTRIN) 800 MG tablet Take 1 tablet (800 mg total) by mouth 3 (three) times daily. 10/16/15   Shawn C Joy, PA-C  meclizine (ANTIVERT) 25 MG tablet Take 0.5 tablets (12.5 mg total) by mouth 2 (two) times daily as needed for dizziness. 02/21/16   Sela Hua, MD  pravastatin (PRAVACHOL) 40 MG tablet Take 1 tablet (40 mg total) by mouth daily. Patient not taking: Reported on 02/20/2016 12/13/15   Micheline Chapman, NP  predniSONE (DELTASONE) 20 MG tablet Take 2 tablets (40 mg total) by mouth daily. 07/01/16   Delsa Grana, PA-C  traMADol (ULTRAM) 50 MG tablet Take 1 tablet (50 mg total) by mouth every 6 (six) hours as needed. 07/01/16   Delsa Grana, PA-C    Family History History reviewed. No pertinent family history.  Social History Social History  Substance Use Topics  . Smoking status: Never Smoker  . Smokeless tobacco: Never Used  . Alcohol use No     Allergies   Review of patient's allergies indicates no known allergies.   Review of Systems Review of Systems  Constitutional: Positive for activity change. Negative for fever.  Gastrointestinal: Positive  for abdominal pain. Negative for bowel incontinence, diarrhea, nausea and vomiting.  Genitourinary: Negative.  Negative for bladder incontinence.  Musculoskeletal: Positive for back pain and gait problem.  All other systems reviewed and are negative.    Physical Exam Triage Vital Signs ED Triage Vitals  Enc Vitals Group     BP 07/07/16 1124 134/64     Pulse Rate 07/07/16 1124 76     Resp 07/07/16 1124 12     Temp 07/07/16 1124 98.1 F (36.7 C)     Temp Source 07/07/16 1124 Oral     SpO2 07/07/16 1124 100 %     Weight --      Height --      Head Circumference --      Peak Flow --      Pain Score 07/07/16 1154 10     Pain Loc --       Pain Edu? --      Excl. in [place]? --    No data found.   Updated Vital Signs BP 134/64 (BP Location: Left Arm)   Pulse 76   Temp 98.1 F (36.7 C) (Oral)   Resp 12   LMP 07/23/2012   SpO2 100%   Visual Acuity Right Eye Distance:   Left Eye Distance:   Bilateral Distance:    Right Eye Near:   Left Eye Near:    Bilateral Near:     Physical Exam  Constitutional: She is oriented to person, place, and time. She appears well-developed and well-nourished. She appears distressed.  Abdominal: Soft. Bowel sounds are normal. She exhibits no mass. There is tenderness. There is no rebound and no guarding. No hernia.  Musculoskeletal: She exhibits tenderness.       Lumbar back: She exhibits decreased range of motion, tenderness and pain. She exhibits no edema and normal pulse.  Neurological: She is alert and oriented to person, place, and time.  Nursing note and vitals reviewed.    UC Treatments / Results  Labs (all labs ordered are listed, but only abnormal results are displayed) Labs Reviewed - No data to display  EKG  EKG Interpretation None       Radiology No results found.  Procedures Procedures (including critical care time)  Medications Ordered in UC Medications - No data to display   Initial Impression / Assessment and Plan / UC Course  I have reviewed the triage vital signs and the nursing notes.  Pertinent labs & imaging results that were available during my care of the patient were reviewed by me and considered in my medical decision making (see chart for details).  Clinical Course    Sent for pain eval, prev seen 10/14.  Final Clinical Impressions(s) / UC Diagnoses   Final diagnoses:  None    New Prescriptions New Prescriptions   No medications on file     Billy Fischer, MD 07/07/16 1204

## 2016-07-07 NOTE — ED Notes (Signed)
Report  Phoned  To  [name]  Nurse  First   Pt  Advised   To  Remain npo

## 2016-07-07 NOTE — ED Triage Notes (Signed)
Pt  Reports     Pain  Both  Sides    X   6  Days     Was  Seen  Er  At that  Time  meds  rx  Are  Not  Helping  Pt  Reports     Pain is  wortse  On  Movement  And  Position

## 2016-07-15 ENCOUNTER — Encounter (HOSPITAL_COMMUNITY): Payer: Self-pay | Admitting: *Deleted

## 2016-07-15 ENCOUNTER — Other Ambulatory Visit: Payer: Self-pay

## 2016-07-15 ENCOUNTER — Emergency Department (HOSPITAL_COMMUNITY)
Admission: EM | Admit: 2016-07-15 | Discharge: 2016-07-15 | Disposition: A | Discharge status: Home / Self Care | Payer: Medicaid Other | Attending: Emergency Medicine | Admitting: Emergency Medicine

## 2016-07-15 ENCOUNTER — Emergency Department (HOSPITAL_COMMUNITY): Payer: Medicaid Other

## 2016-07-15 DIAGNOSIS — E039 Hypothyroidism, unspecified: Secondary | ICD-10-CM | POA: Insufficient documentation

## 2016-07-15 DIAGNOSIS — B029 Zoster without complications: Secondary | ICD-10-CM | POA: Insufficient documentation

## 2016-07-15 DIAGNOSIS — J45909 Unspecified asthma, uncomplicated: Secondary | ICD-10-CM | POA: Insufficient documentation

## 2016-07-15 DIAGNOSIS — R1013 Epigastric pain: Secondary | ICD-10-CM

## 2016-07-15 LAB — URINALYSIS, ROUTINE W REFLEX MICROSCOPIC
BILIRUBIN URINE: NEGATIVE
Glucose, UA: NEGATIVE mg/dL
KETONES UR: NEGATIVE mg/dL
Leukocytes, UA: NEGATIVE
NITRITE: NEGATIVE
PROTEIN: NEGATIVE mg/dL
Specific Gravity, Urine: 1.012 (ref 1.005–1.030)
pH: 6 (ref 5.0–8.0)

## 2016-07-15 LAB — URINE MICROSCOPIC-ADD ON: Bacteria, UA: NONE SEEN

## 2016-07-15 LAB — COMPREHENSIVE METABOLIC PANEL
ALBUMIN: 3.1 g/dL — AB (ref 3.5–5.0)
ALK PHOS: 49 U/L (ref 38–126)
ALT: 23 U/L (ref 14–54)
ANION GAP: 9 (ref 5–15)
AST: 24 U/L (ref 15–41)
BILIRUBIN TOTAL: 0.4 mg/dL (ref 0.3–1.2)
BUN: 13 mg/dL (ref 6–20)
CALCIUM: 8.4 mg/dL — AB (ref 8.9–10.3)
CO2: 25 mmol/L (ref 22–32)
CREATININE: 0.97 mg/dL (ref 0.44–1.00)
Chloride: 101 mmol/L (ref 101–111)
GFR calc Af Amer: >60 mL/min (ref >60)
GFR calc non Af Amer: >60 mL/min (ref >60)
GLUCOSE: 125 mg/dL — AB (ref 65–99)
Potassium: 3.4 mmol/L — ABNORMAL LOW (ref 3.5–5.1)
Sodium: 135 mmol/L (ref 135–145)
TOTAL PROTEIN: 6.1 g/dL — AB (ref 6.5–8.1)

## 2016-07-15 LAB — CBC
HCT: 39.4 % (ref 36.0–46.0)
HEMOGLOBIN: 12.7 g/dL (ref 12.0–15.0)
MCH: 26 pg (ref 26.0–34.0)
MCHC: 32.2 g/dL (ref 30.0–36.0)
MCV: 80.7 fL (ref 78.0–100.0)
PLATELETS: 206 10*3/uL (ref 150–400)
RBC: 4.88 MIL/uL (ref 3.87–5.11)
RDW: 14.3 % (ref 11.5–15.5)
WBC: 7.5 10*3/uL (ref 4.0–10.5)

## 2016-07-15 LAB — LIPASE, BLOOD: Lipase: 27 U/L (ref 11–51)

## 2016-07-15 LAB — TROPONIN I

## 2016-07-15 MED ORDER — OMEPRAZOLE 20 MG PO CPDR: 20.0000 mg | DELAYED_RELEASE_CAPSULE | Freq: Every day | ORAL | 0 refills | Status: DC

## 2016-07-15 MED ORDER — RANITIDINE HCL 150 MG PO TABS: 150.0000 mg | ORAL_TABLET | Freq: Two times a day (BID) | ORAL | 0 refills | Status: DC

## 2016-07-15 MED ORDER — GI COCKTAIL ~~LOC~~
30.0000 mL | Freq: Once | ORAL | Status: AC
  Administered 2016-07-15: 30 mL via ORAL
  Filled 2016-07-15: qty 30

## 2016-07-15 NOTE — ED Provider Notes (Signed)
[facility] DEPT Provider Note   CSN: [id number] Arrival date & time: 07/15/16  1709     History   Chief Complaint Chief Complaint  Patient presents with  . Abdominal Pain    HPI [name] is a 50 y.o. female.  The history is provided by the patient.  Abdominal Pain   This is a recurrent problem. Episode onset: multiple weeks ago worse over last 4 days. The problem occurs constantly. The problem has not changed since onset.The pain is associated with eating. The pain is located in the epigastric region. The pain is moderate. Pertinent negatives include fever and belching. Associated symptoms comments: Metallic taste in mouth. The symptoms are aggravated by eating. Nothing relieves the symptoms.    Past Medical History:  Diagnosis Date  . Arthritis    rt shoulder  . Asthma   . Dysrhythmia    sometimes on exertion  . Headache(784.0)   . Shortness of breath    on exertion    Patient Active Problem List   Diagnosis Date Noted  . Hyperlipidemia   . Asthma, mild intermittent   . Subclinical hyperthyroidism   . Vertigo 02/20/2016  . Anemia 12/05/2012  . PNA (pneumonia) 12/04/2012  . Menorrhagia 12/04/2012    Past Surgical History:  Procedure Laterality Date  . ABDOMINAL HYSTERECTOMY Bilateral 05/15/2013   Procedure: HYSTERECTOMY ABDOMINAL WITH BILATERAL SALPINGECTOMY;  Surgeon: Lavonia Drafts, MD;  Location: [facility];  Service: Gynecology;  Laterality: Bilateral;  . CESAREAN SECTION  1995  . ROBOTIC ASSISTED TOTAL HYSTERECTOMY N/A 05/15/2013   Procedure: ATTEMPTED ROBOTIC TOTAL HYSTERECTOMY;  Surgeon: Lavonia Drafts, MD;  Location: [facility];  Service: Gynecology;  Laterality: N/A;    OB History    Gravida Para Term Preterm AB Living   11 5 1 4 6 5    SAB TAB Ectopic Multiple Live Births   6               Home Medications    Prior to Admission medications   Medication Sig Start Date End Date Taking? Authorizing Provider  albuterol  (PROVENTIL HFA;VENTOLIN HFA) 108 (90 Base) MCG/ACT inhaler Inhale 1-2 puffs into the lungs every 6 (six) hours as needed for wheezing. 12/10/15   Micheline Chapman, NP  cyclobenzaprine (FLEXERIL) 10 MG tablet Take 1 tablet (10 mg total) by mouth 2 (two) times daily as needed for muscle spasms. 07/01/16   Delsa Grana, PA-C  HYDROcodone-acetaminophen (NORCO/VICODIN) 5-325 MG tablet Take 1 tablet by mouth every 6 (six) hours as needed for moderate pain. 07/07/16   Dalia Heading, PA-C  ibuprofen (ADVIL,MOTRIN) 800 MG tablet Take 1 tablet (800 mg total) by mouth 3 (three) times daily. 10/16/15   Shawn C Joy, PA-C  meclizine (ANTIVERT) 25 MG tablet Take 0.5 tablets (12.5 mg total) by mouth 2 (two) times daily as needed for dizziness. 02/21/16   Sela Hua, MD  pravastatin (PRAVACHOL) 40 MG tablet Take 1 tablet (40 mg total) by mouth daily. Patient not taking: Reported on 02/20/2016 12/13/15   Micheline Chapman, NP  predniSONE (DELTASONE) 50 MG tablet Take 1 tablet (50 mg total) by mouth daily with breakfast. 07/07/16   Dalia Heading, PA-C  traMADol (ULTRAM) 50 MG tablet Take 1 tablet (50 mg total) by mouth every 6 (six) hours as needed. 07/01/16   Delsa Grana, PA-C    Family History No family history on file.  Social History Social History  Substance Use Topics  . Smoking status: Never Smoker  . Smokeless  tobacco: Never Used  . Alcohol use No     Allergies   Review of patient's allergies indicates no known allergies.   Review of Systems Review of Systems  Constitutional: Negative for fever.  Gastrointestinal: Positive for abdominal pain.  All other systems reviewed and are negative.    Physical Exam Updated Vital Signs BP 126/84 (BP Location: Left Arm)   Pulse 86   Resp 16   LMP 07/23/2012   SpO2 100%   Physical Exam  Constitutional: She is oriented to person, place, and time. She appears well-developed and well-nourished. No distress.  HENT:  Head: Normocephalic.    Nose: Nose normal.  Eyes: Conjunctivae are normal.  Neck: Neck supple. No tracheal deviation present.  Cardiovascular: Normal rate, regular rhythm and normal heart sounds.   Pulmonary/Chest: Effort normal and breath sounds normal. No respiratory distress. She exhibits no tenderness.  Abdominal: Soft. She exhibits no distension. There is tenderness (epigastric, mild). There is no rebound and no guarding.  Neurological: She is alert and oriented to person, place, and time.  Skin: Skin is warm and dry.  Psychiatric: She has a normal mood and affect.  Vitals reviewed.    ED Treatments / Results  Labs (all labs ordered are listed, but only abnormal results are displayed) Labs Reviewed  COMPREHENSIVE METABOLIC PANEL - Abnormal; Notable for the following:       Result Value   Potassium 3.4 (*)    Glucose, Bld 125 (*)    Calcium 8.4 (*)    Total Protein 6.1 (*)    Albumin 3.1 (*)    All other components within normal limits  URINALYSIS, ROUTINE W REFLEX MICROSCOPIC (NOT AT [facility]) - Abnormal; Notable for the following:    Hgb urine dipstick SMALL (*)    All other components within normal limits  URINE MICROSCOPIC-ADD ON - Abnormal; Notable for the following:    Squamous Epithelial / LPF 0-5 (*)    All other components within normal limits  LIPASE, BLOOD  CBC  TROPONIN I    EKG  EKG Interpretation  Date/Time:  Saturday July 15 2016 17:15:37 EDT Ventricular Rate:  89 PR Interval:  128 QRS Duration: 80 QT Interval:  382 QTC Calculation: 464 R Axis:   59 Text Interpretation:  Sinus rhythm with Premature atrial complexes nonspecific repolarization abnormality similar to prior Abnormal ECG Reconfirmed by Mickle Campton MD, Quillian Quince [phone]) on 07/15/2016 8:33:43 PM       Radiology Dg Chest 2 View  Result Date: 07/15/2016 CLINICAL DATA:  Chest and abdomen pain for the past 3-4 days. EXAM: CHEST  2 VIEW COMPARISON:  07/01/2016. FINDINGS: Normal sized heart. Clear lungs with normal  vascularity. Stable right upper lobe and left mid lung calcified granulomata. No pleural fluid. Thoracic spine degenerative changes. IMPRESSION: No acute abnormality. Electronically Signed   By: Claudie Revering M.D.   On: 07/15/2016 18:15    Procedures Procedures (including critical care time)  Medications Ordered in ED Medications  gi cocktail (Maalox,Lidocaine,Donnatal) (30 mLs Oral Given 07/15/16 2039)     Initial Impression / Assessment and Plan / ED Course  I have reviewed the triage vital signs and the nursing notes.  Pertinent labs & imaging results that were available during my care of the patient were reviewed by me and considered in my medical decision making (see chart for details).  Clinical Course    50 y.o. female presents with Chief complaint of epigastric discomfort for the last 4 days but states that it  is been ongoing for several weeks. Describes the pain spreading across her upper abdomen but no frank chest pain. EKG is similar to prior with nonspecific repolarization abnormality but no signs of ischemia with negative troponin here despite ongoing discomfort. Her pain is reproducible over the upper abdomen and highly atypical for ACS with heart score of 3. She has no leukocytosis, no transaminitis to suggest biliary pathology and pain is more consistent with a gastric etiology such as gastritis or GERD. Provided GI cocktail with plan to start PPI and H2 blocker therapy at home.  Patient has been seen twice previously for back pain but states that she has had a rash for the last week on the left side of her low back. There is a vesicular rash in a dermatomal distribution that is in different stages of healing which would be consistent with a shingles outbreak. I discussed this with the patient and since she is well outside of a window for potential pharmacotherapy to help with this the recommendation would be for pain control and establishing primary care follow-up. Return  precautions discussed for worsening or new concerning symptoms.   Final Clinical Impressions(s) / ED Diagnoses   Final diagnoses:  Epigastric pain  Herpes zoster without complication    New Prescriptions Discharge Medication List as of 07/15/2016  8:28 PM    START taking these medications   Details  omeprazole (PRILOSEC) 20 MG capsule Take 1 capsule (20 mg total) by mouth daily., Starting Sat 07/15/2016, Print    ranitidine (ZANTAC) 150 MG tablet Take 1 tablet (150 mg total) by mouth 2 (two) times daily., Starting Sat 07/15/2016, Print         Leo Grosser, MD 07/15/16 2113

## 2016-07-15 NOTE — ED Triage Notes (Signed)
The pt has ha these symptoms   For longer than 4 days approx 2 weeks

## 2016-07-15 NOTE — ED Triage Notes (Signed)
The pt is c/o abd and chest pain for 3-4 days  She was seen here for the same she is no better she has nausea and her main pain is in her abd that goes in to her epigastric area

## 2016-08-14 ENCOUNTER — Ambulatory Visit: Payer: Medicaid Other | Admitting: Family Medicine

## 2017-01-12 ENCOUNTER — Ambulatory Visit: Payer: Self-pay | Admitting: Family Medicine

## 2017-01-16 ENCOUNTER — Emergency Department (HOSPITAL_COMMUNITY)
Admission: EM | Admit: 2017-01-16 | Discharge: 2017-01-16 | Disposition: A | Discharge status: Home / Self Care | Payer: Self-pay | Attending: Emergency Medicine | Admitting: Emergency Medicine

## 2017-01-16 ENCOUNTER — Ambulatory Visit: Payer: Self-pay | Admitting: Family Medicine

## 2017-01-16 ENCOUNTER — Emergency Department (HOSPITAL_COMMUNITY): Payer: Self-pay

## 2017-01-16 ENCOUNTER — Encounter (HOSPITAL_COMMUNITY): Payer: Self-pay | Admitting: Emergency Medicine

## 2017-01-16 DIAGNOSIS — Z760 Encounter for issue of repeat prescription: Secondary | ICD-10-CM | POA: Insufficient documentation

## 2017-01-16 DIAGNOSIS — J45909 Unspecified asthma, uncomplicated: Secondary | ICD-10-CM | POA: Insufficient documentation

## 2017-01-16 DIAGNOSIS — Z79899 Other long term (current) drug therapy: Secondary | ICD-10-CM | POA: Insufficient documentation

## 2017-01-16 DIAGNOSIS — R05 Cough: Secondary | ICD-10-CM | POA: Insufficient documentation

## 2017-01-16 DIAGNOSIS — R059 Cough, unspecified: Secondary | ICD-10-CM

## 2017-01-16 MED ORDER — ALBUTEROL SULFATE HFA 108 (90 BASE) MCG/ACT IN AERS
2.0000 | INHALATION_SPRAY | Freq: Once | RESPIRATORY_TRACT | Status: AC
  Administered 2017-01-16: 2 via RESPIRATORY_TRACT
  Filled 2017-01-16: qty 6.7

## 2017-01-16 NOTE — Discharge Instructions (Signed)
It is very important that you follow up with your primary care provider for discussion of today's visit. Use inhaler as needed. Return to the emergency department for new or worsening symptoms, any additional concerns.

## 2017-01-16 NOTE — ED Triage Notes (Signed)
Pt stated that she needs assistance paying for her asthma medicine

## 2017-01-16 NOTE — ED Provider Notes (Signed)
[facility] DEPT Provider Note   CSN: [id number] Arrival date & time: 01/16/17  1331  By signing my name below, I, [name], attest that this documentation has been prepared under the direction and in the presence of [facility], PA-C. Electronically Signed: Evelene Osborn, Scribe. 01/16/2017. 2:38 PM.  History   Chief Complaint Chief Complaint  Patient presents with  . Medication Refill    The history is provided by the patient. The history is limited by a language barrier. Language interpreter used: Son at bedside aiding in translation as needed.    HPI Comments:  [name] is a 51 y.o. female with a history of asthma, who presents to the Emergency Department for a medication refill. Pt is out of her pro air inhaler. Mild cough and shortness of breath as she has not had home inhaler to use. No alleviating factors noted. Pt denies smoking.   Past Medical History:  Diagnosis Date  . Arthritis    rt shoulder  . Asthma   . Dysrhythmia    sometimes on exertion  . Headache(784.0)   . Shortness of breath    on exertion    Patient Active Problem List   Diagnosis Date Noted  . Hyperlipidemia   . Asthma, mild intermittent   . Subclinical hyperthyroidism   . Vertigo 02/20/2016  . Anemia 12/05/2012  . PNA (pneumonia) 12/04/2012  . Menorrhagia 12/04/2012    Past Surgical History:  Procedure Laterality Date  . ABDOMINAL HYSTERECTOMY Bilateral 05/15/2013   Procedure: HYSTERECTOMY ABDOMINAL WITH BILATERAL SALPINGECTOMY;  Surgeon: Lavonia Drafts, MD;  Location: [facility];  Service: Gynecology;  Laterality: Bilateral;  . CESAREAN SECTION  1995  . ROBOTIC ASSISTED TOTAL HYSTERECTOMY N/A 05/15/2013   Procedure: ATTEMPTED ROBOTIC TOTAL HYSTERECTOMY;  Surgeon: Lavonia Drafts, MD;  Location: [facility];  Service: Gynecology;  Laterality: N/A;    OB History    Gravida Para Term Preterm AB Living   11 5 1 4 6 5    SAB TAB Ectopic Multiple Live Births   6               Home Medications    Prior to Admission medications   Medication Sig Start Date End Date Taking? Authorizing Provider  albuterol (PROVENTIL HFA;VENTOLIN HFA) 108 (90 Base) MCG/ACT inhaler Inhale 1-2 puffs into the lungs every 6 (six) hours as needed for wheezing. 12/10/15   Micheline Chapman, NP  cyclobenzaprine (FLEXERIL) 10 MG tablet Take 1 tablet (10 mg total) by mouth 2 (two) times daily as needed for muscle spasms. 07/01/16   Delsa Grana, PA-C  HYDROcodone-acetaminophen (NORCO/VICODIN) 5-325 MG tablet Take 1 tablet by mouth every 6 (six) hours as needed for moderate pain. 07/07/16   Dalia Heading, PA-C  ibuprofen (ADVIL,MOTRIN) 800 MG tablet Take 1 tablet (800 mg total) by mouth 3 (three) times daily. 10/16/15   Shawn C Joy, PA-C  meclizine (ANTIVERT) 25 MG tablet Take 0.5 tablets (12.5 mg total) by mouth 2 (two) times daily as needed for dizziness. 02/21/16   Sela Hua, MD  omeprazole (PRILOSEC) 20 MG capsule Take 1 capsule (20 mg total) by mouth daily. 07/15/16   Leo Grosser, MD  pravastatin (PRAVACHOL) 40 MG tablet Take 1 tablet (40 mg total) by mouth daily. Patient not taking: Reported on 02/20/2016 12/13/15   Micheline Chapman, NP  predniSONE (DELTASONE) 50 MG tablet Take 1 tablet (50 mg total) by mouth daily with breakfast. 07/07/16   Dalia Heading, PA-C  ranitidine (ZANTAC) 150 MG tablet  Take 1 tablet (150 mg total) by mouth 2 (two) times daily. 07/15/16   Leo Grosser, MD  traMADol (ULTRAM) 50 MG tablet Take 1 tablet (50 mg total) by mouth every 6 (six) hours as needed. 07/01/16   Delsa Grana, PA-C    Family History History reviewed. No pertinent family history.  Social History Social History  Substance Use Topics  . Smoking status: Never Smoker  . Smokeless tobacco: Never Used  . Alcohol use No     Allergies   Patient has no known allergies.   Review of Systems Review of Systems  Respiratory: Positive for cough and shortness of breath. Negative for  wheezing.   All other systems reviewed and are negative.   Physical Exam Updated Vital Signs BP 132/67 (BP Location: Left Arm)   Pulse 87   Temp 98.3 F (36.8 C) (Oral)   Resp (!) 24   Wt 92.3 kg   LMP 01/20/2013   SpO2 99%   BMI 33.86 kg/m   Physical Exam  Constitutional: She is oriented to person, place, and time. She appears well-developed and well-nourished. No distress.  HENT:  Head: Normocephalic and atraumatic.  Neck: Neck supple. No JVD present.  Cardiovascular: Normal rate, regular rhythm and normal heart sounds.   No murmur heard. Pulmonary/Chest: Effort normal and breath sounds normal. No respiratory distress. She has no wheezes. She has no rales. She exhibits no tenderness.  Abdominal: Soft. She exhibits no distension. There is no tenderness.  Musculoskeletal: She exhibits no edema.  Neurological: She is alert and oriented to person, place, and time.  Skin: Skin is warm and dry.  Nursing note and vitals reviewed.   ED Treatments / Results  DIAGNOSTIC STUDIES:  Oxygen Saturation is 99% on RA, normal by my interpretation.    COORDINATION OF CARE:  2:34 PM Discussed treatment plan with pt at bedside and pt agreed to plan.  Labs (all labs ordered are listed, but only abnormal results are displayed) Labs Reviewed - No data to display  EKG  EKG Interpretation None       Radiology Dg Chest 2 View  Result Date: 01/16/2017 CLINICAL DATA:  Asthma and cough EXAM: CHEST  2 VIEW COMPARISON:  None. FINDINGS: There are small calcified granulomas bilaterally, stable. No edema or consolidation. Heart size and pulmonary vascularity are normal. No adenopathy. There is degenerative change in each shoulder as well as in the mid thoracic region. IMPRESSION: Scattered small calcified granulomas.  No edema or consolidation. Electronically Signed   By: Lowella Grip III M.D.   On: 01/16/2017 14:48    Procedures Procedures (including critical care time)  Medications  Ordered in ED Medications  albuterol (PROVENTIL HFA;VENTOLIN HFA) 108 (90 Base) MCG/ACT inhaler 2 puff (2 puffs Inhalation Given 01/16/17 1515)     Initial Impression / Assessment and Plan / ED Course  I have reviewed the triage vital signs and the nursing notes.  Pertinent labs & imaging results that were available during my care of the patient were reviewed by me and considered in my medical decision making (see chart for details).     Pt here for refill of albuterol inhaler medication. Lungs are clear. VSS.  Discussed need to follow up with PCP in 2-3 days. Inhaler provided in ED today.  Pt is safe for discharge at this time.   Final Clinical Impressions(s) / ED Diagnoses   Final diagnoses:  Cough  Medication refill    New Prescriptions Discharge Medication List as of 01/16/2017  3:31 PM     I personally performed the services described in this documentation, which was scribed in my presence. The recorded information has been reviewed and is accurate.    [facility] Floyd Lusignan, PA-C 01/16/17 [place], MD 01/17/17 1051

## 2017-07-26 ENCOUNTER — Other Ambulatory Visit: Payer: Self-pay

## 2017-07-26 ENCOUNTER — Emergency Department (HOSPITAL_COMMUNITY): Payer: Self-pay

## 2017-07-26 ENCOUNTER — Encounter (HOSPITAL_COMMUNITY): Payer: Self-pay

## 2017-07-26 DIAGNOSIS — R1011 Right upper quadrant pain: Secondary | ICD-10-CM | POA: Insufficient documentation

## 2017-07-26 DIAGNOSIS — R110 Nausea: Secondary | ICD-10-CM | POA: Insufficient documentation

## 2017-07-26 DIAGNOSIS — Z79899 Other long term (current) drug therapy: Secondary | ICD-10-CM | POA: Insufficient documentation

## 2017-07-26 DIAGNOSIS — J4520 Mild intermittent asthma, uncomplicated: Secondary | ICD-10-CM | POA: Insufficient documentation

## 2017-07-26 DIAGNOSIS — M549 Dorsalgia, unspecified: Secondary | ICD-10-CM | POA: Insufficient documentation

## 2017-07-26 DIAGNOSIS — M25572 Pain in left ankle and joints of left foot: Secondary | ICD-10-CM | POA: Insufficient documentation

## 2017-07-26 DIAGNOSIS — R1013 Epigastric pain: Secondary | ICD-10-CM | POA: Insufficient documentation

## 2017-07-26 LAB — CBC
HEMATOCRIT: 35 % — AB (ref 36.0–46.0)
Hemoglobin: 11.4 g/dL — ABNORMAL LOW (ref 12.0–15.0)
MCH: 26.5 pg (ref 26.0–34.0)
MCHC: 32.6 g/dL (ref 30.0–36.0)
MCV: 81.2 fL (ref 78.0–100.0)
Platelets: 173 10*3/uL (ref 150–400)
RBC: 4.31 MIL/uL (ref 3.87–5.11)
RDW: 13.6 % (ref 11.5–15.5)
WBC: 4.6 10*3/uL (ref 4.0–10.5)

## 2017-07-26 LAB — I-STAT TROPONIN, ED: Troponin i, poc: 0 ng/mL (ref 0.00–0.08)

## 2017-07-26 LAB — BASIC METABOLIC PANEL
ANION GAP: 4 — AB (ref 5–15)
BUN: 12 mg/dL (ref 6–20)
CHLORIDE: 110 mmol/L (ref 101–111)
CO2: 26 mmol/L (ref 22–32)
Calcium: 8.5 mg/dL — ABNORMAL LOW (ref 8.9–10.3)
Creatinine, Ser: 0.92 mg/dL (ref 0.44–1.00)
GFR calc non Af Amer: >60 mL/min (ref >60)
Glucose, Bld: 147 mg/dL — ABNORMAL HIGH (ref 65–99)
POTASSIUM: 3.5 mmol/L (ref 3.5–5.1)
Sodium: 140 mmol/L (ref 135–145)

## 2017-07-26 NOTE — ED Triage Notes (Signed)
Pt states that yesterday she began to have CP, central with SOB and radiation to back, denies n/v. Pt also adds that she has a bunion on her L foot that hurts.

## 2017-07-27 ENCOUNTER — Emergency Department (HOSPITAL_COMMUNITY)
Admission: EM | Admit: 2017-07-27 | Discharge: 2017-07-27 | Disposition: A | Discharge status: Home / Self Care | Payer: Self-pay | Attending: Emergency Medicine | Admitting: Emergency Medicine

## 2017-07-27 ENCOUNTER — Emergency Department (HOSPITAL_COMMUNITY): Payer: Self-pay

## 2017-07-27 DIAGNOSIS — R1013 Epigastric pain: Secondary | ICD-10-CM

## 2017-07-27 DIAGNOSIS — M25572 Pain in left ankle and joints of left foot: Secondary | ICD-10-CM

## 2017-07-27 DIAGNOSIS — R52 Pain, unspecified: Secondary | ICD-10-CM

## 2017-07-27 LAB — HEPATIC FUNCTION PANEL
ALT: 22 U/L (ref 14–54)
AST: 28 U/L (ref 15–41)
Albumin: 3.4 g/dL — ABNORMAL LOW (ref 3.5–5.0)
Alkaline Phosphatase: 57 U/L (ref 38–126)
BILIRUBIN TOTAL: 0.1 mg/dL — AB (ref 0.3–1.2)
Total Protein: 6.1 g/dL — ABNORMAL LOW (ref 6.5–8.1)

## 2017-07-27 LAB — LIPASE, BLOOD: LIPASE: 30 U/L (ref 11–51)

## 2017-07-27 MED ORDER — FENTANYL CITRATE (PF) 100 MCG/2ML IJ SOLN
50.0000 ug | Freq: Once | INTRAMUSCULAR | Status: AC
  Administered 2017-07-27: 50 ug via INTRAVENOUS
  Filled 2017-07-27: qty 2

## 2017-07-27 MED ORDER — ONDANSETRON HCL 4 MG/2ML IJ SOLN
4.0000 mg | Freq: Once | INTRAMUSCULAR | Status: AC
  Administered 2017-07-27: 4 mg via INTRAVENOUS
  Filled 2017-07-27: qty 2

## 2017-07-27 NOTE — ED Notes (Addendum)
Daughter notified on pt.'s discharge plan per EDP. She drank H2O without getting nauseous .

## 2017-07-27 NOTE — ED Provider Notes (Signed)
[facility] EMERGENCY DEPARTMENT Provider Note   CSN: [id number] Arrival date & time: 07/26/17  2044     History   Chief Complaint Chief Complaint  Patient presents with  . Chest Pain    HPI [name] is a 51 y.o. female.  The history is provided by the patient and a relative.  Chest Pain   This is a new problem. The current episode started yesterday. The problem occurs constantly. The problem has not changed since onset.The pain is present in the epigastric region. The pain is moderate. The quality of the pain is described as burning. Associated symptoms include abdominal pain, back pain and nausea. Pertinent negatives include no fever and no vomiting. She has tried nothing for the symptoms.  Patient with h/o arthritis and asthma presents with chest pain/abdominal pain and also back pain She reports abd pain started over past 24 hrs and worsening No fever/vomiting She also mentions back pain right around her bra strap  She also mentions pain to left ankle and bunion on left foot This is not new but hurts to walk  History limited as patient speaks limited english and speaks Janit Pagan, an african dialect which is difficult to find interpeters Daughter speaks great english and assists with history  Past Medical History:  Diagnosis Date  . Arthritis    rt shoulder  . Asthma   . Dysrhythmia    sometimes on exertion  . Headache(784.0)   . Shortness of breath    on exertion    Patient Active Problem List   Diagnosis Date Noted  . Hyperlipidemia   . Asthma, mild intermittent   . Subclinical hyperthyroidism   . Vertigo 02/20/2016  . Anemia 12/05/2012  . PNA (pneumonia) 12/04/2012  . Menorrhagia 12/04/2012    Past Surgical History:  Procedure Laterality Date  . CESAREAN SECTION  1995    OB History    Gravida Para Term Preterm AB Living   11 5 1 4 6 5    SAB TAB Ectopic Multiple Live Births   6               Home Medications    Prior  to Admission medications   Medication Sig Start Date End Date Taking? Authorizing Provider  albuterol (PROVENTIL HFA;VENTOLIN HFA) 108 (90 Base) MCG/ACT inhaler Inhale 1-2 puffs into the lungs every 6 (six) hours as needed for wheezing. Patient not taking: Reported on 07/26/2017 12/10/15   Micheline Chapman, NP  cyclobenzaprine (FLEXERIL) 10 MG tablet Take 1 tablet (10 mg total) by mouth 2 (two) times daily as needed for muscle spasms. Patient not taking: Reported on 07/26/2017 07/01/16   Delsa Grana, PA-C  HYDROcodone-acetaminophen (NORCO/VICODIN) 5-325 MG tablet Take 1 tablet by mouth every 6 (six) hours as needed for moderate pain. Patient not taking: Reported on 07/26/2017 07/07/16   Dalia Heading, PA-C  ibuprofen (ADVIL,MOTRIN) 800 MG tablet Take 1 tablet (800 mg total) by mouth 3 (three) times daily. Patient not taking: Reported on 07/26/2017 10/16/15   Lorayne Bender, PA-C  meclizine (ANTIVERT) 25 MG tablet Take 0.5 tablets (12.5 mg total) by mouth 2 (two) times daily as needed for dizziness. Patient not taking: Reported on 07/26/2017 02/21/16   MayoPete Pelt, MD  omeprazole (PRILOSEC) 20 MG capsule Take 1 capsule (20 mg total) by mouth daily. Patient not taking: Reported on 07/26/2017 07/15/16   Leo Grosser, MD  pravastatin (PRAVACHOL) 40 MG tablet Take 1 tablet (40 mg total) by mouth daily.  Patient not taking: Reported on 02/20/2016 12/13/15   Micheline Chapman, NP  predniSONE (DELTASONE) 50 MG tablet Take 1 tablet (50 mg total) by mouth daily with breakfast. Patient not taking: Reported on 07/26/2017 07/07/16   Dalia Heading, PA-C  ranitidine (ZANTAC) 150 MG tablet Take 1 tablet (150 mg total) by mouth 2 (two) times daily. Patient not taking: Reported on 07/26/2017 07/15/16   Leo Grosser, MD  traMADol (ULTRAM) 50 MG tablet Take 1 tablet (50 mg total) by mouth every 6 (six) hours as needed. Patient not taking: Reported on 07/26/2017 07/01/16   Delsa Grana, PA-C    Family  History No family history on file.  Social History Social History   Tobacco Use  . Smoking status: Never Smoker  . Smokeless tobacco: Never Used  Substance Use Topics  . Alcohol use: No  . Drug use: No     Allergies   Patient has no known allergies.   Review of Systems Review of Systems  Constitutional: Negative for fever.  Cardiovascular: Positive for chest pain.  Gastrointestinal: Positive for abdominal pain and nausea. Negative for vomiting.  Musculoskeletal: Positive for back pain.  All other systems reviewed and are negative.    Physical Exam Updated Vital Signs BP (!) 148/92 (BP Location: Right Arm)   Pulse 69   Temp 98.7 F (37.1 C)   Resp 14   LMP 01/20/2013   SpO2 100%   Physical Exam CONSTITUTIONAL: Well developed/well nourished HEAD: Normocephalic/atraumatic EYES: EOMI/PERRL ENMT: Mucous membranes moist NECK: supple no meningeal signs SPINE/BACK:mild tenderness to thoracic spine where bra strap is located, no erythema or bruising noted CV: S1/S2 noted, no murmurs/rubs/gallops noted LUNGS: Lungs are clear to auscultation bilaterally, no apparent distress ABDOMEN: soft, moderate epigastric tenderness, no rebound or guarding, bowel sounds noted throughout abdomen GU:no cva tenderness NEURO: Pt is awake/alert/appropriate, moves all extremitiesx4.  No facial droop.   EXTREMITIES: pulses normal/equal, full ROM, tenderness to left ankle and bunion on left foot but no erythema or signs of trauma.  No effusion noted SKIN: warm, color normal PSYCH: no abnormalities of mood noted, alert and oriented to situation   ED Treatments / Results  Labs (all labs ordered are listed, but only abnormal results are displayed) Labs Reviewed  BASIC METABOLIC PANEL - Abnormal; Notable for the following components:      Result Value   Glucose, Bld 147 (*)    Calcium 8.5 (*)    Anion gap 4 (*)    All other components within normal limits  CBC - Abnormal; Notable for  the following components:   Hemoglobin 11.4 (*)    HCT 35.0 (*)    All other components within normal limits  HEPATIC FUNCTION PANEL - Abnormal; Notable for the following components:   Total Protein 6.1 (*)    Albumin 3.4 (*)    Total Bilirubin 0.1 (*)    Bilirubin, Direct <0.1 (*)    All other components within normal limits  LIPASE, BLOOD  I-STAT TROPONIN, ED    EKG ED ECG REPORT   Date: 07/26/2017 2102  Rate: 75  Rhythm: normal sinus rhythm  QRS Axis: normal  Intervals: normal  ST/T Wave abnormalities: ST depressions diffusely  Conduction Disutrbances:none  Narrative Interpretation:   Old EKG Reviewed: unchanged  I have personally reviewed the EKG tracing and agree with the computerized printout as noted.  Radiology Dg Chest 2 View  Result Date: 07/26/2017 CLINICAL DATA:  Chest pain shortness of breath EXAM: CHEST  2  VIEW COMPARISON:  05/01/ 2018 FINDINGS: Mild cardiomegaly with prominent central vessels. Small calcified nodule in the right upper lobe unchanged. No pleural effusion. No focal consolidation. No pneumothorax. IMPRESSION: Mild cardiomegaly.  Negative for edema or focal pulmonary infiltrate Electronically Signed   By: Donavan Foil M.D.   On: 07/26/2017 22:07   US Abdomen Limited Ruq  Result Date: 07/27/2017 CLINICAL DATA:  Right upper quadrant, back, and chest pain. Nausea and vomiting. EXAM: ULTRASOUND ABDOMEN LIMITED RIGHT UPPER QUADRANT COMPARISON:  CT abdomen pelvis 07/03/2015 FINDINGS: Gallbladder: No gallstones or wall thickening visualized. No sonographic Murphy sign noted by sonographer. Common bile duct: Diameter: 2.9 mm, normal Liver: Hepatic parenchymal echotexture appears to be increased. This could be artifact from body habitus or may indicate fatty infiltration. No focal lesions are identified. Portal vein is patent on color Doppler imaging with normal direction of blood flow towards the liver. IMPRESSION: No evidence of cholelithiasis or  cholecystitis. Possible fatty infiltration of the liver versus artifact. Electronically Signed   By: Lucienne Capers M.D.   On: 07/27/2017 03:19    Procedures Procedures   Medications Ordered in ED Medications  fentaNYL (SUBLIMAZE) injection 50 mcg (50 mcg Intravenous Given 07/27/17 0104)  ondansetron (ZOFRAN) injection 4 mg (4 mg Intravenous Given 07/27/17 0105)     Initial Impression / Assessment and Plan / ED Course  I have reviewed the triage vital signs and the nursing notes.  Pertinent labs & imaging results that were available during my care of the patient were reviewed by me and considered in my medical decision making (see chart for details).     2:02 AM Exam more c/w epigastric pain rather than actual CP Will treat pain and reassess No EKG changes Labs reassuring at this point 5:06 AM Pt monitored for several hours Pt stable, no acute worsening On repeat assessment her pain was always in the epigastric region Suspicion for ACS is low US imaging negative for biliary disease  Will d/c home Discussed with patient/daughter  As for foot/ankle pain, referred to podiatry but this is not an acute issue at this time Final Clinical Impressions(s) / ED Diagnoses   Final diagnoses:  Pain  Epigastric pain  Arthralgia of left ankle    ED Discharge Orders    None       Ripley Fraise, MD 07/27/17 0507

## 2017-11-29 ENCOUNTER — Encounter (HOSPITAL_COMMUNITY): Payer: Self-pay | Admitting: Emergency Medicine

## 2017-11-29 ENCOUNTER — Ambulatory Visit (HOSPITAL_COMMUNITY)
Admission: EM | Admit: 2017-11-29 | Discharge: 2017-11-29 | Disposition: A | Discharge status: Home / Self Care | Payer: Self-pay | Attending: Internal Medicine | Admitting: Internal Medicine

## 2017-11-29 DIAGNOSIS — G8929 Other chronic pain: Secondary | ICD-10-CM

## 2017-11-29 DIAGNOSIS — M25562 Pain in left knee: Secondary | ICD-10-CM

## 2017-11-29 DIAGNOSIS — M5442 Lumbago with sciatica, left side: Secondary | ICD-10-CM

## 2017-11-29 DIAGNOSIS — M25561 Pain in right knee: Secondary | ICD-10-CM

## 2017-11-29 MED ORDER — ALBUTEROL SULFATE HFA 108 (90 BASE) MCG/ACT IN AERS
INHALATION_SPRAY | RESPIRATORY_TRACT | Status: AC
  Filled 2017-11-29: qty 6.7

## 2017-11-29 MED ORDER — PREDNISONE 20 MG PO TABS: 40.0000 mg | ORAL_TABLET | Freq: Every day | ORAL | 0 refills | Status: AC

## 2017-11-29 MED ORDER — MELOXICAM 10 MG PO CAPS: 10.0000 mg | ORAL_CAPSULE | Freq: Every day | ORAL | 0 refills | Status: DC

## 2017-11-29 NOTE — Discharge Instructions (Signed)
Light and regular activity as tolerated. Ice to areas affected after increased use. Elevate legs after work especially. Use of compression stockings especially while at work. 5 days of predisone Daily meloxicam. Do not take additional ibuprofen or aleve.  Please make an appointment to follow up with your primary care provider for a recheck of symptoms in the next week.

## 2017-11-29 NOTE — ED Triage Notes (Signed)
PT has had bilateral leg swelling for months that has worsened recently. Family reports a "lump" near left buttocks. PT is not walking as much as she used to due to pain

## 2017-11-29 NOTE — ED Provider Notes (Signed)
[place]    CSN: [id number] Arrival date & time: 11/29/17  1847     History   Chief Complaint Chief Complaint  Patient presents with  . Leg Swelling    HPI [name] is a 52 y.o. female.   [place] presents with family with complaints of bilateral leg pain, worse after working all night, as well as left low back pain which radiates down leg. She feels her legs swell at times. She has been told in the past she has arthritis, does not take any medications for this. No known injury. Without numbness or tingling. Activity seems to worsen the pain, it is worse when she first wakes up as well. This has been ongoing for some time now. History of asthma, headaches, shortness of breath    ROS per HPI.       Past Medical History:  Diagnosis Date  . Arthritis    rt shoulder  . Asthma   . Dysrhythmia    sometimes on exertion  . Headache(784.0)   . Shortness of breath    on exertion    Patient Active Problem List   Diagnosis Date Noted  . Hyperlipidemia   . Asthma, mild intermittent   . Subclinical hyperthyroidism   . Vertigo 02/20/2016  . Anemia 12/05/2012  . PNA (pneumonia) 12/04/2012  . Menorrhagia 12/04/2012    Past Surgical History:  Procedure Laterality Date  . ABDOMINAL HYSTERECTOMY Bilateral 05/15/2013   Procedure: HYSTERECTOMY ABDOMINAL WITH BILATERAL SALPINGECTOMY;  Surgeon: Lavonia Drafts, MD;  Location: [facility];  Service: Gynecology;  Laterality: Bilateral;  . CESAREAN SECTION  1995  . ROBOTIC ASSISTED TOTAL HYSTERECTOMY N/A 05/15/2013   Procedure: ATTEMPTED ROBOTIC TOTAL HYSTERECTOMY;  Surgeon: Lavonia Drafts, MD;  Location: [facility];  Service: Gynecology;  Laterality: N/A;    OB History    Gravida Para Term Preterm AB Living   11 5 1 4 6 5    SAB TAB Ectopic Multiple Live Births   6               Home Medications    Prior to Admission medications   Medication Sig Start Date End Date Taking? Authorizing  Provider  Meloxicam 10 MG CAPS Take 10 mg by mouth daily. 11/29/17   Zigmund Gottron, NP  predniSONE (DELTASONE) 20 MG tablet Take 2 tablets (40 mg total) by mouth daily with breakfast for 5 days. 11/29/17 12/04/17  Zigmund Gottron, NP    Family History History reviewed. No pertinent family history.  Social History Social History   Tobacco Use  . Smoking status: Never Smoker  . Smokeless tobacco: Never Used  Substance Use Topics  . Alcohol use: No  . Drug use: No     Allergies   Patient has no known allergies.   Review of Systems Review of Systems   Physical Exam Triage Vital Signs ED Triage Vitals [11/29/17 2012]  Enc Vitals Group     BP (!) 153/70     Pulse Rate 72     Resp 15     Temp 98.2 F (36.8 C)     Temp src      SpO2 100 %     Weight 200 lb (90.7 kg)     Height      Head Circumference      Peak Flow      Pain Score      Pain Loc      Pain Edu?      Excl.  in [place]?    No data found.  Updated Vital Signs BP (!) 153/70   Pulse 72   Temp 98.2 F (36.8 C)   Resp 15   Wt 200 lb (90.7 kg)   LMP 01/20/2013   SpO2 100%   BMI 33.28 kg/m   Visual Acuity Right Eye Distance:   Left Eye Distance:   Bilateral Distance:    Right Eye Near:   Left Eye Near:    Bilateral Near:     Physical Exam  Constitutional: She is oriented to person, place, and time. She appears well-developed and well-nourished. No distress.  Cardiovascular: Normal rate, regular rhythm and normal heart sounds.  Pulmonary/Chest: Effort normal and breath sounds normal.  Musculoskeletal:       Right hip: Normal.       Left hip: Normal.       Right knee: Normal.       Left knee: Normal.       Right ankle: She exhibits normal range of motion, no swelling, no deformity, no laceration and normal pulse. Tenderness.       Left ankle: She exhibits normal range of motion, no swelling, no deformity, no laceration and normal pulse. Tenderness.       Lumbar back: She exhibits pain. She  exhibits no bony tenderness and no swelling.       Back:       Right foot: There is normal range of motion, no tenderness, no bony tenderness, no swelling and normal capillary refill.       Left foot: There is normal range of motion, no tenderness, no bony tenderness, no swelling and normal capillary refill.  Tenderness to bilateral posterior ankles on palpation as well as to arch of feet; without pitting edema present and minimal edema in general noted; sensation intact; ambulatory; strong pedal pulses and cap refill < 2seconds; strength equal bilaterally to lower extremities; left low back pain on palpation and with use of left leg and hip  Neurological: She is alert and oriented to person, place, and time.  Skin: Skin is warm and dry.     UC Treatments / Results  Labs (all labs ordered are listed, but only abnormal results are displayed) Labs Reviewed - No data to display  EKG  EKG Interpretation None       Radiology No results found.  Procedures Procedures (including critical care time)  Medications Ordered in UC Medications - No data to display   Initial Impression / Assessment and Plan / UC Course  I have reviewed the triage vital signs and the nursing notes.  Pertinent labs & imaging results that were available during my care of the patient were reviewed by me and considered in my medical decision making (see chart for details).     Lungs clear to auscultation at this time. Without pitting edema. Per chart review patient does appear to have chronic left low back pain. 5 days of prednisone and daily meloxicam for arthritic symptoms. Encouraged use of compression stockings. Regular activity. Encouraged follow up with PCP for further management. Patient and family verbalized understanding and agreeable to plan.    Final Clinical Impressions(s) / UC Diagnoses   Final diagnoses:  Chronic left-sided low back pain with left-sided sciatica  Arthralgia of both lower legs      ED Discharge Orders        Ordered    predniSONE (DELTASONE) 20 MG tablet  Daily with breakfast     11/29/17 2052  Meloxicam 10 MG CAPS  Daily     11/29/17 2052       Controlled Substance Prescriptions [place] Controlled Substance Registry consulted? Not Applicable   Zigmund Gottron, NP 11/29/17 2126

## 2017-12-07 ENCOUNTER — Encounter (HOSPITAL_COMMUNITY): Payer: Self-pay | Admitting: Emergency Medicine

## 2017-12-07 ENCOUNTER — Ambulatory Visit (HOSPITAL_COMMUNITY)
Admission: EM | Admit: 2017-12-07 | Discharge: 2017-12-07 | Disposition: A | Discharge status: Home / Self Care | Payer: Self-pay | Attending: Family Medicine | Admitting: Family Medicine

## 2017-12-07 ENCOUNTER — Other Ambulatory Visit: Payer: Self-pay

## 2017-12-07 DIAGNOSIS — K625 Hemorrhage of anus and rectum: Secondary | ICD-10-CM

## 2017-12-07 DIAGNOSIS — M25561 Pain in right knee: Secondary | ICD-10-CM

## 2017-12-07 LAB — OCCULT BLOOD, POC DEVICE: FECAL OCCULT BLD: POSITIVE — AB

## 2017-12-07 MED ORDER — DICLOFENAC SODIUM 50 MG PO TBEC: 50.0000 mg | DELAYED_RELEASE_TABLET | Freq: Two times a day (BID) | ORAL | 0 refills | Status: AC

## 2017-12-07 NOTE — ED Triage Notes (Signed)
Per daughter when had BM today pt. Noticed blood from rectum and c/o bilateral heel and hip pain, no known injury

## 2017-12-07 NOTE — ED Provider Notes (Signed)
[place]   [id number] 12/07/17 Arrival Time: 8786   SUBJECTIVE:  [name] is a 52 y.o. female who presents to the urgent care with complaint of blood from rectum and c/o bilateral heel and hip pain, no known injury.  The pain in her feet she describes going up the legs, worse on the right.  Non-positional.  No fever.  Present over a month.  No urinary symptoms.  The rectal bleeding occurred just today.  No abdominal pain.  Past Medical History:  Diagnosis Date  . Arthritis    rt shoulder  . Asthma   . Dysrhythmia    sometimes on exertion  . Headache(784.0)   . Shortness of breath    on exertion   History reviewed. No pertinent family history. Social History   Socioeconomic History  . Marital status: Married    Spouse name: Not on file  . Number of children: Not on file  . Years of education: Not on file  . Highest education level: Not on file  Occupational History  . Not on file  Social Needs  . Financial resource strain: Not on file  . Food insecurity:    Worry: Not on file    Inability: Not on file  . Transportation needs:    Medical: Not on file    Non-medical: Not on file  Tobacco Use  . Smoking status: Never Smoker  . Smokeless tobacco: Never Used  Substance and Sexual Activity  . Alcohol use: No  . Drug use: No  . Sexual activity: Never  Lifestyle  . Physical activity:    Days per week: Not on file    Minutes per session: Not on file  . Stress: Not on file  Relationships  . Social connections:    Talks on phone: Not on file    Gets together: Not on file    Attends religious service: Not on file    Active member of club or organization: Not on file    Attends meetings of clubs or organizations: Not on file    Relationship status: Not on file  . Intimate partner violence:    Fear of current or ex partner: Not on file    Emotionally abused: Not on file    Physically abused: Not on file    Forced sexual activity: Not on  file  Other Topics Concern  . Not on file  Social History Narrative  . Not on file   No outpatient medications have been marked as taking for the 12/07/17 encounter [facility] Encounter).   No Known Allergies    ROS: As per HPI, remainder of ROS negative.   OBJECTIVE:   Vitals:   12/07/17 1747 12/07/17 1749  BP:  (!) 128/56  Pulse:  67  Temp:  97.9 F (36.6 C)  TempSrc: Oral Oral  SpO2:  99%     General appearance: alert; no distress Eyes: PERRL; EOMI; conjunctiva normal HENT: normocephalic; atraumatic;  oral mucosa normal Neck: supple Abdomen: soft, non-tender; bowel sounds normal; no masses or organomegaly; no guarding or rebound tenderness Rectal by nurse:  No hemorrhoid, obvious blood on glove which is heme positive. Back: no CVA tenderness Extremities: no cyanosis or edema; symmetrical with no gross deformities; negative straight leg raising. Skin: warm and dry Neurologic: normal gait; grossly normal Psychological: alert and cooperative; normal mood and affect      Labs:  Results for orders placed or performed during the hospital encounter of 12/07/17  Occult blood, poc  device  Result Value Ref Range   Fecal Occult Bld POSITIVE (A) NEGATIVE    Labs Reviewed  OCCULT BLOOD, POC DEVICE - Abnormal; Notable for the following components:      Result Value   Fecal Occult Bld POSITIVE (*)    All other components within normal limits  OCCULT BLOOD X 1 CARD TO LAB, STOOL    No results found.     ASSESSMENT & PLAN:  1. Rectal bleeding   2. Arthralgia of right lower leg     Meds ordered this encounter  Medications  . diclofenac (VOLTAREN) 50 MG EC tablet    Sig: Take 1 tablet (50 mg total) by mouth 2 (two) times daily.    Dispense:  14 tablet    Refill:  0    Reviewed expectations re: course of current medical issues. Questions answered. Outlined signs and symptoms indicating need for more acute intervention. Patient verbalized  understanding. After Visit Summary given.    Procedures:      Robyn Haber, MD 12/07/17 [id number]

## 2017-12-07 NOTE — Discharge Instructions (Addendum)
If rectal bleeding becomes heavy, go to the emergency room.  You will need to schedule an appointment with [facility] gastroenterology.  Next week, we will try to get you a primary care doctor.

## 2017-12-10 ENCOUNTER — Other Ambulatory Visit: Payer: Self-pay

## 2017-12-10 ENCOUNTER — Encounter (HOSPITAL_COMMUNITY): Payer: Self-pay

## 2017-12-10 DIAGNOSIS — E039 Hypothyroidism, unspecified: Secondary | ICD-10-CM | POA: Insufficient documentation

## 2017-12-10 DIAGNOSIS — M79671 Pain in right foot: Secondary | ICD-10-CM | POA: Insufficient documentation

## 2017-12-10 DIAGNOSIS — M5442 Lumbago with sciatica, left side: Secondary | ICD-10-CM | POA: Insufficient documentation

## 2017-12-10 DIAGNOSIS — Z79899 Other long term (current) drug therapy: Secondary | ICD-10-CM | POA: Insufficient documentation

## 2017-12-10 DIAGNOSIS — M5441 Lumbago with sciatica, right side: Secondary | ICD-10-CM | POA: Insufficient documentation

## 2017-12-10 DIAGNOSIS — J4520 Mild intermittent asthma, uncomplicated: Secondary | ICD-10-CM | POA: Insufficient documentation

## 2017-12-10 DIAGNOSIS — M79672 Pain in left foot: Secondary | ICD-10-CM | POA: Insufficient documentation

## 2017-12-10 NOTE — ED Triage Notes (Signed)
Pt presents to the ed with complaints of lower back pain and feet pain x 3 weeks. Pt also complaining of vaginal bleeding x 1 week. denies any injury to back.

## 2017-12-11 ENCOUNTER — Emergency Department (HOSPITAL_COMMUNITY)
Admission: EM | Admit: 2017-12-11 | Discharge: 2017-12-11 | Disposition: A | Discharge status: Home / Self Care | Payer: Self-pay | Attending: Emergency Medicine | Admitting: Emergency Medicine

## 2017-12-11 ENCOUNTER — Emergency Department (HOSPITAL_COMMUNITY): Payer: Self-pay

## 2017-12-11 DIAGNOSIS — M5442 Lumbago with sciatica, left side: Secondary | ICD-10-CM

## 2017-12-11 DIAGNOSIS — G8929 Other chronic pain: Secondary | ICD-10-CM

## 2017-12-11 DIAGNOSIS — M79671 Pain in right foot: Secondary | ICD-10-CM

## 2017-12-11 DIAGNOSIS — M5441 Lumbago with sciatica, right side: Secondary | ICD-10-CM

## 2017-12-11 DIAGNOSIS — M79672 Pain in left foot: Secondary | ICD-10-CM

## 2017-12-11 MED ORDER — NAPROXEN 500 MG PO TABS: 500.0000 mg | ORAL_TABLET | Freq: Two times a day (BID) | ORAL | 0 refills | Status: AC

## 2017-12-11 MED ORDER — IBUPROFEN 800 MG PO TABS
800.0000 mg | ORAL_TABLET | Freq: Once | ORAL | Status: AC
  Administered 2017-12-11: 800 mg via ORAL
  Filled 2017-12-11: qty 1

## 2017-12-11 MED ORDER — METHOCARBAMOL 500 MG PO TABS
500.0000 mg | ORAL_TABLET | Freq: Once | ORAL | Status: AC
  Administered 2017-12-11: 500 mg via ORAL
  Filled 2017-12-11: qty 1

## 2017-12-11 MED ORDER — METHOCARBAMOL 500 MG PO TABS: 500.0000 mg | ORAL_TABLET | Freq: Every evening | ORAL | 0 refills | Status: AC | PRN

## 2017-12-11 NOTE — ED Notes (Signed)
Pt sitting in lobby, advised to wait for social work consult later this am.

## 2017-12-11 NOTE — ED Provider Notes (Signed)
[facility] EMERGENCY DEPARTMENT Provider Note   CSN: [id number] Arrival date & time: 12/10/17  1617     History   Chief Complaint Chief Complaint  Patient presents with  . Back Pain  . Vaginal Bleeding    HPI [name] is a 52 y.o. female presenting with 3 weeks of bilateral low back pain radiating to her buttocks bilaterally. She reports bilateral heel pain. Denies any injury or trauma. No loss of bowel or bladder function, no fever or history of IV drug use. No weakness or numbness. Patient has tried [place] with some relief. Pain is aggravated by twisting her torso. Mentions of vaginal bleeding from triage. Patient denies any vaginal or rectal bleeding.   HPI  Past Medical History:  Diagnosis Date  . Arthritis    rt shoulder  . Asthma   . Dysrhythmia    sometimes on exertion  . Headache(784.0)   . Shortness of breath    on exertion    Patient Active Problem List   Diagnosis Date Noted  . Hyperlipidemia   . Asthma, mild intermittent   . Subclinical hyperthyroidism   . Vertigo 02/20/2016  . Anemia 12/05/2012  . PNA (pneumonia) 12/04/2012  . Menorrhagia 12/04/2012    Past Surgical History:  Procedure Laterality Date  . ABDOMINAL HYSTERECTOMY Bilateral 05/15/2013   Procedure: HYSTERECTOMY ABDOMINAL WITH BILATERAL SALPINGECTOMY;  Surgeon: Lavonia Drafts, MD;  Location: [facility];  Service: Gynecology;  Laterality: Bilateral;  . CESAREAN SECTION  1995  . ROBOTIC ASSISTED TOTAL HYSTERECTOMY N/A 05/15/2013   Procedure: ATTEMPTED ROBOTIC TOTAL HYSTERECTOMY;  Surgeon: Lavonia Drafts, MD;  Location: [facility];  Service: Gynecology;  Laterality: N/A;     OB History    Gravida  11   Para  5   Term  1   Preterm  4   AB  6   Living  5     SAB  6   TAB      Ectopic      Multiple      Live Births               Home Medications    Prior to Admission medications   Medication Sig Start Date End Date Taking?  Authorizing Provider  diclofenac (VOLTAREN) 50 MG EC tablet Take 1 tablet (50 mg total) by mouth 2 (two) times daily. 12/07/17  Yes Robyn Haber, MD  methocarbamol (ROBAXIN) 500 MG tablet Take 1 tablet (500 mg total) by mouth at bedtime as needed. 12/11/17   Avie Echevaria B, PA-C  naproxen (NAPROSYN) 500 MG tablet Take 1 tablet (500 mg total) by mouth 2 (two) times daily. 12/11/17   Emeline General, PA-C    Family History No family history on file.  Social History Social History   Tobacco Use  . Smoking status: Never Smoker  . Smokeless tobacco: Never Used  Substance Use Topics  . Alcohol use: No  . Drug use: No     Allergies   Patient has no known allergies.   Review of Systems Review of Systems  Constitutional: Negative for appetite change, chills, diaphoresis, fatigue, fever and unexpected weight change.  Cardiovascular: Negative for chest pain and leg swelling.  Gastrointestinal: Negative for abdominal distention, abdominal pain, nausea and vomiting.  Genitourinary: Negative for decreased urine volume, difficulty urinating, dysuria, flank pain and pelvic pain.  Musculoskeletal: Positive for back pain and myalgias. Negative for arthralgias, joint swelling, neck pain and neck stiffness.  Heel pad pain bilaterally  Skin: Negative for color change, pallor, rash and wound.  Neurological: Negative for weakness and numbness.     Physical Exam Updated Vital Signs BP 139/73   Pulse 67   Temp 98.3 F (36.8 C) (Oral)   Resp 16   LMP 01/20/2013   SpO2 98%   Physical Exam  Constitutional: She appears well-developed and well-nourished. No distress.  Afebrile, nontoxic, lying comfortably in bed in no acute distress.  HENT:  Head: Normocephalic and atraumatic.  Eyes: EOM are normal.  Neck: Normal range of motion. Neck supple.  Cardiovascular: Normal rate, regular rhythm, normal heart sounds and intact distal pulses.  No murmur heard. Pulmonary/Chest: Effort  normal and breath sounds normal. No stridor. No respiratory distress. She has no wheezes.  Abdominal: She exhibits no distension.  Musculoskeletal: Normal range of motion. She exhibits tenderness. She exhibits no edema.  ttp of lower back musculature.  Neurological: She is alert. No sensory deficit. She exhibits normal muscle tone.  ambulating without difficulty. Reporting pain in the pad of her heel bilaterally when she walks. 5/5 strength in lower extremities bilaterally.  Skin: Skin is warm and dry. Capillary refill takes less than 2 seconds. No rash noted. She is not diaphoretic. No erythema. No pallor.  Psychiatric: She has a normal mood and affect.  Nursing note and vitals reviewed.    ED Treatments / Results  Labs (all labs ordered are listed, but only abnormal results are displayed) Labs Reviewed - No data to display  EKG None  Radiology Dg Lumbar Spine Complete  Result Date: 12/11/2017 CLINICAL DATA:  52 year old female with low back pain. No known injury. EXAM: LUMBAR SPINE - COMPLETE 4+ VIEW COMPARISON:  CT of the abdomen pelvis dated 07/03/2015 FINDINGS: There is no acute fracture or subluxation of the lumbar spine. The vertebral body heights and disc spaces are maintained. Mild osteophyte along the anterior L3-L4. The visualized posterior elements are intact. The soft tissues are grossly unremarkable. IMPRESSION: No acute fracture or subluxation. Electronically Signed   By: Anner Crete M.D.   On: 12/11/2017 04:43    Procedures Procedures (including critical care time)  Medications Ordered in ED Medications  methocarbamol (ROBAXIN) tablet 500 mg (500 mg Oral Given 12/11/17 0406)  ibuprofen (ADVIL,MOTRIN) tablet 800 mg (800 mg Oral Given 12/11/17 0406)     Initial Impression / Assessment and Plan / ED Course  I have reviewed the triage vital signs and the nursing notes.  Pertinent labs & imaging results that were available during my care of the patient were  reviewed by me and considered in my medical decision making (see chart for details).    Normal neuro exam and patient is ambulatory. She is requesting a note to stay off work for a week.  Urged patient to follow up with the wellness center for primary care. Consulted case management for assistance with prescription and primary care follow up.  Patient has been seen in the ED multiple times for back pain. No fever, chills, urinary symptoms, iv drug use, loss or bowel or bladder function. She also reports bilateral heel pain and pain with pressure to her heels. Patient is wearing flip flops and I discussed the importance of wearing shoes that would provide cushioning and support.   Patient presents with lower back pain.  No gross neurological deficits and normal neuro exam.  Patient has no gait abnormality or concern for cauda equina.  No loss of bowel or bladder control, fever, night  sweats, weight loss, h/o malignancy, or IVDU.  RICE protocol and pain medications indicated and discussed with patient.   Discharge home with symptomatic relief and close PCP follow up.  Discussed strict return precautions and advised to return to the emergency department if experiencing any new or worsening symptoms. Instructions were understood and patient agreed with discharge plan.  Final Clinical Impressions(s) / ED Diagnoses   Final diagnoses:  Chronic bilateral low back pain with bilateral sciatica  Heel pain, bilateral    ED Discharge Orders        Ordered    methocarbamol (ROBAXIN) 500 MG tablet  At bedtime PRN     12/11/17 0502    naproxen (NAPROSYN) 500 MG tablet  2 times daily     12/11/17 0502       Emeline General, PA-C 95/74/73 [id number]    Delora Fuel, MD 09/64/38 [phone]

## 2017-12-11 NOTE — ED Notes (Signed)
Patient transported to X-ray 

## 2017-12-11 NOTE — Discharge Instructions (Addendum)
As discussed, The medicine prescribed can help with muscle spasm but cannot be taken if driving, with alcohol or operating machinery. Apply warm compresses and massage. Take naproxen twice a day with food. Use the back exercises provided. Get shoe insoles to help provide support and padding. Wear padded comfortable shoes when not at work with good support.  Follow up with your Primary care provider if symptoms  persist beyond a week.  Return if worsening or new concerning symptoms in the meantime.

## 2018-04-21 ENCOUNTER — Emergency Department (HOSPITAL_COMMUNITY)
Admission: EM | Admit: 2018-04-21 | Discharge: 2018-04-22 | Disposition: A | Discharge status: Home / Self Care | Payer: Self-pay | Attending: Emergency Medicine | Admitting: Emergency Medicine

## 2018-04-21 ENCOUNTER — Other Ambulatory Visit: Payer: Self-pay

## 2018-04-21 ENCOUNTER — Emergency Department (HOSPITAL_COMMUNITY): Payer: Self-pay

## 2018-04-21 DIAGNOSIS — R0789 Other chest pain: Secondary | ICD-10-CM | POA: Insufficient documentation

## 2018-04-21 DIAGNOSIS — J45909 Unspecified asthma, uncomplicated: Secondary | ICD-10-CM | POA: Insufficient documentation

## 2018-04-21 LAB — CBC
HEMATOCRIT: 40.1 % (ref 36.0–46.0)
HEMOGLOBIN: 12.1 g/dL (ref 12.0–15.0)
MCH: 26.1 pg (ref 26.0–34.0)
MCHC: 30.2 g/dL (ref 30.0–36.0)
MCV: 86.4 fL (ref 78.0–100.0)
Platelets: 181 10*3/uL (ref 150–400)
RBC: 4.64 MIL/uL (ref 3.87–5.11)
RDW: 14.1 % (ref 11.5–15.5)
WBC: 4.6 10*3/uL (ref 4.0–10.5)

## 2018-04-21 LAB — I-STAT TROPONIN, ED: Troponin i, poc: 0 ng/mL (ref 0.00–0.08)

## 2018-04-21 NOTE — ED Triage Notes (Addendum)
Patient c/o centralized CP since Tuesday, EMS gave 4 asa and 2 nitro with some relief. Patient speaks another language primarily (broken english) and wants [place] to wait for her dad to get here to answer other questions.

## 2018-04-21 NOTE — ED Notes (Signed)
Blood work was re-drawn because lab states that it hemolyzed.

## 2018-04-22 LAB — BASIC METABOLIC PANEL
Anion gap: 8 (ref 5–15)
BUN: 15 mg/dL (ref 6–20)
CALCIUM: 8.4 mg/dL — AB (ref 8.9–10.3)
CO2: 26 mmol/L (ref 22–32)
Chloride: 103 mmol/L (ref 98–111)
Creatinine, Ser: 1.01 mg/dL — ABNORMAL HIGH (ref 0.44–1.00)
GFR calc Af Amer: >60 mL/min (ref >60)
Glucose, Bld: 151 mg/dL — ABNORMAL HIGH (ref 70–99)
Potassium: 3.8 mmol/L (ref 3.5–5.1)
Sodium: 137 mmol/L (ref 135–145)

## 2018-04-22 LAB — I-STAT TROPONIN, ED: Troponin i, poc: 0 ng/mL (ref 0.00–0.08)

## 2018-04-22 MED ORDER — KETOROLAC TROMETHAMINE 30 MG/ML IJ SOLN
30.0000 mg | Freq: Once | INTRAMUSCULAR | Status: AC
  Administered 2018-04-22: 30 mg via INTRAVENOUS
  Filled 2018-04-22: qty 1

## 2018-04-22 NOTE — ED Provider Notes (Signed)
[facility] EMERGENCY DEPARTMENT Provider Note   CSN: [id number] Arrival date & time: 04/21/18  2229     History   Chief Complaint Chief Complaint  Patient presents with  . Chest Pain    HPI [name] is a 52 y.o. female.  Patient is a 52 year old female with past medical history of asthma, arthritis, headaches.  She presents today for evaluation of chest pain.  This been ongoing since this afternoon and began in the absence of any injury or trauma.  She has some difficulty expressing the nature of her discomfort secondary to a language barrier, however reports the pain is sharp in nature and worse with movement and palpation.  She denies any shortness of breath, nausea, diaphoresis, or radiation to the arm or jaw.  She denies any recent exertional symptoms.  From what the patient tells me and I can tell from the medical record, she has no prior cardiac history.  The history is provided by the patient.  Chest Pain   This is a new problem. Episode onset: This afternoon. The problem occurs constantly. The problem has been gradually worsening. The pain is present in the substernal region. The pain is moderate. The quality of the pain is described as sharp. The pain does not radiate. Pertinent negatives include no diaphoresis, no fever, no nausea and no shortness of breath. She has tried nothing for the symptoms. The treatment provided no relief. There are no known risk factors.    Past Medical History:  Diagnosis Date  . Arthritis    rt shoulder  . Asthma   . Dysrhythmia    sometimes on exertion  . Headache(784.0)   . Shortness of breath    on exertion    Patient Active Problem List   Diagnosis Date Noted  . Hyperlipidemia   . Asthma, mild intermittent   . Subclinical hyperthyroidism   . Vertigo 02/20/2016  . Anemia 12/05/2012  . PNA (pneumonia) 12/04/2012  . Menorrhagia 12/04/2012    Past Surgical History:  Procedure Laterality Date  .  ABDOMINAL HYSTERECTOMY Bilateral 05/15/2013   Procedure: HYSTERECTOMY ABDOMINAL WITH BILATERAL SALPINGECTOMY;  Surgeon: Lavonia Drafts, MD;  Location: [facility];  Service: Gynecology;  Laterality: Bilateral;  . CESAREAN SECTION  1995  . ROBOTIC ASSISTED TOTAL HYSTERECTOMY N/A 05/15/2013   Procedure: ATTEMPTED ROBOTIC TOTAL HYSTERECTOMY;  Surgeon: Lavonia Drafts, MD;  Location: [facility];  Service: Gynecology;  Laterality: N/A;     OB History    Gravida  11   Para  5   Term  1   Preterm  4   AB  6   Living  5     SAB  6   TAB      Ectopic      Multiple      Live Births               Home Medications    Prior to Admission medications   Medication Sig Start Date End Date Taking? Authorizing Provider  naproxen sodium (ALEVE) 220 MG tablet Take 220-440 mg by mouth daily as needed.   Yes [provider]  diclofenac (VOLTAREN) 50 MG EC tablet Take 1 tablet (50 mg total) by mouth 2 (two) times daily. Patient not taking: Reported on 04/21/2018 12/07/17   Robyn Haber, MD  methocarbamol (ROBAXIN) 500 MG tablet Take 1 tablet (500 mg total) by mouth at bedtime as needed. Patient not taking: Reported on 04/21/2018 12/11/17   Emeline General, PA-C  naproxen (NAPROSYN) 500 MG tablet Take 1 tablet (500 mg total) by mouth 2 (two) times daily. Patient not taking: Reported on 04/21/2018 12/11/17   Dossie Der    Family History No family history on file.  Social History Social History   Tobacco Use  . Smoking status: Never Smoker  . Smokeless tobacco: Never Used  Substance Use Topics  . Alcohol use: No  . Drug use: No     Allergies   Patient has no known allergies.   Review of Systems Review of Systems  Constitutional: Negative for diaphoresis and fever.  Respiratory: Negative for shortness of breath.   Cardiovascular: Positive for chest pain.  Gastrointestinal: Negative for nausea.  All other systems reviewed and are  negative.    Physical Exam Updated Vital Signs Ht 5\' 2"  (1.575 m)   Wt 90.7 kg (200 lb)   LMP 01/20/2013   BMI 36.58 kg/m   Physical Exam  Constitutional: She is oriented to person, place, and time. She appears well-developed and well-nourished. No distress.  HENT:  Head: Normocephalic and atraumatic.  Neck: Normal range of motion. Neck supple.  Cardiovascular: Normal rate and regular rhythm. Exam reveals no gallop and no friction rub.  No murmur heard. Pulmonary/Chest: Effort normal and breath sounds normal. No respiratory distress. She has no wheezes.  There is tenderness to palpation of the anterior chest wall that reproduces her symptoms.  Abdominal: Soft. Bowel sounds are normal. She exhibits no distension. There is no tenderness.  Musculoskeletal: Normal range of motion.  Neurological: She is alert and oriented to person, place, and time.  Skin: Skin is warm and dry. She is not diaphoretic.  Nursing note and vitals reviewed.    ED Treatments / Results  Labs (all labs ordered are listed, but only abnormal results are displayed) Labs Reviewed  CBC  BASIC METABOLIC PANEL  I-STAT TROPONIN, ED    EKG EKG Interpretation  Date/Time:  Sunday April 21 2018 22:31:39 EDT Ventricular Rate:  78 PR Interval:    QRS Duration: 93 QT Interval:  410 QTC Calculation: 467 R Axis:   45 Text Interpretation:  Sinus rhythm Paired ventricular premature complexes similar to previous Confirmed by Ezequiel Essex [id number]) on 04/21/2018 11:29:03 PM   Radiology No results found.  Procedures Procedures (including critical care time)  Medications Ordered in ED Medications  ketorolac (TORADOL) 30 MG/ML injection 30 mg (has no administration in time range)     Initial Impression / Assessment and Plan / ED Course  I have reviewed the triage vital signs and the nursing notes.  Pertinent labs & imaging results that were available during my care of the patient were reviewed by me and  considered in my medical decision making (see chart for details).  Patient presents here with complaints of chest discomfort that is atypical for cardiac pain.  It is sharp in nature and located to the front of her chest.  It is worse with palpation and is easily reproducible.  Work-up reveals negative troponin x2 and unchanged EKG.  She was given Toradol and is feeling better.  At this point I see no indication for admission and feel as though this is not cardiac in nature.  She is to follow-up with her primary doctor as needed.  Final Clinical Impressions(s) / ED Diagnoses   Final diagnoses:  None    ED Discharge Orders    None       Veryl Speak, MD 04/22/18 [phone]

## 2018-04-22 NOTE — Discharge Instructions (Addendum)
Ibuprofen 600 mg every 6 hours as needed for pain.  Follow-up with her primary doctor if not improving in the next few days, and return to the ER if your symptoms significantly worsen or change.

## 2018-11-04 ENCOUNTER — Other Ambulatory Visit: Payer: Self-pay

## 2018-11-04 ENCOUNTER — Emergency Department (HOSPITAL_COMMUNITY)
Admission: EM | Admit: 2018-11-04 | Discharge: 2018-11-05 | Disposition: A | Discharge status: Home / Self Care | Payer: Self-pay | Attending: Emergency Medicine | Admitting: Emergency Medicine

## 2018-11-04 ENCOUNTER — Encounter (HOSPITAL_COMMUNITY): Payer: Self-pay | Admitting: *Deleted

## 2018-11-04 DIAGNOSIS — R531 Weakness: Secondary | ICD-10-CM | POA: Insufficient documentation

## 2018-11-04 DIAGNOSIS — R42 Dizziness and giddiness: Secondary | ICD-10-CM | POA: Insufficient documentation

## 2018-11-04 DIAGNOSIS — Z5321 Procedure and treatment not carried out due to patient leaving prior to being seen by health care provider: Secondary | ICD-10-CM | POA: Insufficient documentation

## 2018-11-04 MED ORDER — SODIUM CHLORIDE 0.9% FLUSH: 3.0000 mL | Freq: Once | INTRAVENOUS | Status: DC

## 2018-11-04 NOTE — ED Triage Notes (Signed)
Pt c/o dizziness, generalized weakness, and NVD since Saturday.

## 2018-11-05 ENCOUNTER — Emergency Department (HOSPITAL_COMMUNITY)
Admission: EM | Admit: 2018-11-05 | Discharge: 2018-11-05 | Disposition: A | Discharge status: Home / Self Care | Payer: Self-pay | Attending: Emergency Medicine | Admitting: Emergency Medicine

## 2018-11-05 ENCOUNTER — Encounter (HOSPITAL_COMMUNITY): Payer: Self-pay

## 2018-11-05 ENCOUNTER — Other Ambulatory Visit: Payer: Self-pay

## 2018-11-05 DIAGNOSIS — J45909 Unspecified asthma, uncomplicated: Secondary | ICD-10-CM | POA: Insufficient documentation

## 2018-11-05 DIAGNOSIS — R42 Dizziness and giddiness: Secondary | ICD-10-CM | POA: Insufficient documentation

## 2018-11-05 DIAGNOSIS — Z79899 Other long term (current) drug therapy: Secondary | ICD-10-CM | POA: Insufficient documentation

## 2018-11-05 LAB — CBC
HCT: 39.4 % (ref 36.0–46.0)
HEMATOCRIT: 37.9 % (ref 36.0–46.0)
Hemoglobin: 11.2 g/dL — ABNORMAL LOW (ref 12.0–15.0)
Hemoglobin: 11.7 g/dL — ABNORMAL LOW (ref 12.0–15.0)
MCH: 25.1 pg — ABNORMAL LOW (ref 26.0–34.0)
MCH: 25.3 pg — ABNORMAL LOW (ref 26.0–34.0)
MCHC: 29.6 g/dL — AB (ref 30.0–36.0)
MCHC: 29.7 g/dL — ABNORMAL LOW (ref 30.0–36.0)
MCV: 84.8 fL (ref 80.0–100.0)
MCV: 85.1 fL (ref 80.0–100.0)
PLATELETS: 158 10*3/uL (ref 150–400)
Platelets: 167 10*3/uL (ref 150–400)
RBC: 4.47 MIL/uL (ref 3.87–5.11)
RBC: 4.63 MIL/uL (ref 3.87–5.11)
RDW: 13.6 % (ref 11.5–15.5)
RDW: 13.7 % (ref 11.5–15.5)
WBC: 4.1 10*3/uL (ref 4.0–10.5)
WBC: 4.3 10*3/uL (ref 4.0–10.5)
nRBC: 0 % (ref 0.0–0.2)
nRBC: 0 % (ref 0.0–0.2)

## 2018-11-05 LAB — BASIC METABOLIC PANEL
ANION GAP: 9 (ref 5–15)
Anion gap: 7 (ref 5–15)
BUN: 11 mg/dL (ref 6–20)
BUN: 16 mg/dL (ref 6–20)
CO2: 24 mmol/L (ref 22–32)
CO2: 27 mmol/L (ref 22–32)
Calcium: 8.4 mg/dL — ABNORMAL LOW (ref 8.9–10.3)
Calcium: 8.9 mg/dL (ref 8.9–10.3)
Chloride: 105 mmol/L (ref 98–111)
Chloride: 108 mmol/L (ref 98–111)
Creatinine, Ser: 0.94 mg/dL (ref 0.44–1.00)
Creatinine, Ser: 0.96 mg/dL (ref 0.44–1.00)
GFR calc Af Amer: >60 mL/min (ref >60)
GFR calc Af Amer: >60 mL/min (ref >60)
GFR calc non Af Amer: >60 mL/min (ref >60)
GLUCOSE: 180 mg/dL — AB (ref 70–99)
Glucose, Bld: 123 mg/dL — ABNORMAL HIGH (ref 70–99)
POTASSIUM: 2.9 mmol/L — AB (ref 3.5–5.1)
Potassium: 3.3 mmol/L — ABNORMAL LOW (ref 3.5–5.1)
Sodium: 138 mmol/L (ref 135–145)
Sodium: 142 mmol/L (ref 135–145)

## 2018-11-05 LAB — URINALYSIS, ROUTINE W REFLEX MICROSCOPIC
BILIRUBIN URINE: NEGATIVE
GLUCOSE, UA: NEGATIVE mg/dL
HGB URINE DIPSTICK: NEGATIVE
KETONES UR: NEGATIVE mg/dL
Leukocytes,Ua: NEGATIVE
Nitrite: NEGATIVE
PH: 6 (ref 5.0–8.0)
Protein, ur: NEGATIVE mg/dL
Specific Gravity, Urine: 1.015 (ref 1.005–1.030)

## 2018-11-05 LAB — CBG MONITORING, ED: Glucose-Capillary: 85 mg/dL (ref 70–99)

## 2018-11-05 MED ORDER — MECLIZINE HCL 25 MG PO TABS
25.0000 mg | ORAL_TABLET | Freq: Once | ORAL | Status: AC
  Administered 2018-11-05: 25 mg via ORAL
  Filled 2018-11-05: qty 1

## 2018-11-05 MED ORDER — POTASSIUM CHLORIDE CRYS ER 20 MEQ PO TBCR
40.0000 meq | EXTENDED_RELEASE_TABLET | Freq: Once | ORAL | Status: AC
  Administered 2018-11-05: 40 meq via ORAL
  Filled 2018-11-05: qty 2

## 2018-11-05 MED ORDER — LORAZEPAM 2 MG/ML IJ SOLN
0.5000 mg | Freq: Once | INTRAMUSCULAR | Status: AC
  Administered 2018-11-05: 0.5 mg via INTRAVENOUS
  Filled 2018-11-05: qty 1

## 2018-11-05 MED ORDER — SODIUM CHLORIDE 0.9% FLUSH
3.0000 mL | Freq: Once | INTRAVENOUS | Status: AC
  Administered 2018-11-05: 3 mL via INTRAVENOUS

## 2018-11-05 MED ORDER — MECLIZINE HCL 25 MG PO TABS: 25.0000 mg | ORAL_TABLET | Freq: Three times a day (TID) | ORAL | 0 refills | Status: AC | PRN

## 2018-11-05 MED ORDER — SODIUM CHLORIDE 0.9 % IV BOLUS
1000.0000 mL | Freq: Once | INTRAVENOUS | Status: AC
  Administered 2018-11-05: 1000 mL via INTRAVENOUS

## 2018-11-05 NOTE — Discharge Instructions (Signed)
It was my pleasure taking care of you today!   Meclizine as needed for dizziness.  Stay hydrated.  Be careful walking around at home.  Call your doctor tomorrow to schedule a follow up appointment.   Return to the ER for new or worsening symptoms, any additional concerns.

## 2018-11-05 NOTE — ED Triage Notes (Signed)
Pt reports she was sick last week with vomiting and has continued to have persistent generalized weakness. Pt aoX4. Seen here last night but left.

## 2018-11-05 NOTE — ED Notes (Signed)
Pt ambulated slowly in hall way stating her head felt "Heavy" while walking and if she looked down she got dizzy.

## 2018-11-05 NOTE — ED Provider Notes (Signed)
[facility] DEPARTMENT Provider Note   CSN: [id number] Arrival date & time: 11/05/18  1203    History   Chief Complaint Chief Complaint  Patient presents with  . Weakness    HPI [name] is a 53 y.o. female.     The history is provided by the patient and medical records. No language interpreter was used (Family at bedside aiding in translation, offered official Forensic scientist which she declined.).  Weakness  Associated symptoms: abdominal pain, diarrhea, dizziness, nausea and vomiting   Associated symptoms: no headaches     [name] is a 53 y.o. female  with a PMH of vertigo who presents to the Emergency Department complaining of generalized weakness dizziness.  Patient reports that she developed nausea, vomiting and diarrhea on Friday (5 days ago).  This lasted about a day and had resolved by Saturday night.  Since then, she has felt very weak and can hear her heart beating loudly, although does not have any chest pain or palpitations. She reports dizziness that feels as if the room is spinning. This gets worse with movement of her head or getting up quickly. No medications taken prior to arrival for symptoms.  Ports a similar episode a couple of years ago.  Per chart review this appears to be in June 2017 where she was admitted for persistent dizziness.  MRI did not show any acute findings and she was subsequently discharged with dizziness resolved with medication.  Denies any numbness or focal extremity weakness.  No slurred speech.    Past Medical History:  Diagnosis Date  . Arthritis    rt shoulder  . Asthma   . Dysrhythmia    sometimes on exertion  . Headache(784.0)   . Shortness of breath    on exertion    Patient Active Problem List   Diagnosis Date Noted  . Hyperlipidemia   . Asthma, mild intermittent   . Subclinical hyperthyroidism   . Vertigo 02/20/2016  . Anemia 12/05/2012  . PNA (pneumonia) 12/04/2012  .  Menorrhagia 12/04/2012    Past Surgical History:  Procedure Laterality Date  . ABDOMINAL HYSTERECTOMY Bilateral 05/15/2013   Procedure: HYSTERECTOMY ABDOMINAL WITH BILATERAL SALPINGECTOMY;  Surgeon: Lavonia Drafts, MD;  Location: [facility];  Service: Gynecology;  Laterality: Bilateral;  . CESAREAN SECTION  1995  . ROBOTIC ASSISTED TOTAL HYSTERECTOMY N/A 05/15/2013   Procedure: ATTEMPTED ROBOTIC TOTAL HYSTERECTOMY;  Surgeon: Lavonia Drafts, MD;  Location: [facility];  Service: Gynecology;  Laterality: N/A;     OB History    Gravida  11   Para  5   Term  1   Preterm  4   AB  6   Living  5     SAB  6   TAB      Ectopic      Multiple      Live Births               Home Medications    Prior to Admission medications   Medication Sig Start Date End Date Taking? Authorizing Provider  diclofenac (VOLTAREN) 50 MG EC tablet Take 1 tablet (50 mg total) by mouth 2 (two) times daily. Patient not taking: Reported on 04/21/2018 12/07/17   Robyn Haber, MD  meclizine (ANTIVERT) 25 MG tablet Take 1 tablet (25 mg total) by mouth 3 (three) times daily as needed for dizziness. 11/05/18   , Ozella Almond, PA-C  methocarbamol (ROBAXIN) 500 MG tablet Take 1 tablet (500 mg  total) by mouth at bedtime as needed. Patient not taking: Reported on 04/21/2018 12/11/17   Avie Echevaria B, PA-C  naproxen (NAPROSYN) 500 MG tablet Take 1 tablet (500 mg total) by mouth 2 (two) times daily. Patient not taking: Reported on 04/21/2018 12/11/17   Avie Echevaria B, PA-C  naproxen sodium (ALEVE) 220 MG tablet Take 220-440 mg by mouth daily as needed.    [provider]    Family History History reviewed. No pertinent family history.  Social History Social History   Tobacco Use  . Smoking status: Never Smoker  . Smokeless tobacco: Never Used  Substance Use Topics  . Alcohol use: No  . Drug use: No     Allergies   Patient has no known allergies.   Review of  Systems Review of Systems  Gastrointestinal: Positive for abdominal pain, diarrhea, nausea and vomiting.       All GI symptoms have now resolved  Neurological: Positive for dizziness and weakness. Negative for syncope, numbness and headaches.  All other systems reviewed and are negative.    Physical Exam Updated Vital Signs BP (!) 132/91   Pulse 60   Temp 98 F (36.7 C) (Oral)   Resp 20   LMP 01/20/2013   SpO2 100%   Physical Exam Vitals signs and nursing note reviewed.  Constitutional:      General: She is not in acute distress.    Appearance: She is well-developed.  HENT:     Head: Normocephalic and atraumatic.  Neck:     Musculoskeletal: Neck supple.  Cardiovascular:     Rate and Rhythm: Normal rate and regular rhythm.     Heart sounds: Normal heart sounds. No murmur.  Pulmonary:     Effort: Pulmonary effort is normal. No respiratory distress.     Breath sounds: Normal breath sounds.  Abdominal:     General: There is no distension.     Palpations: Abdomen is soft.     Comments: No abdominal tenderness appreciated.  Skin:    General: Skin is warm and dry.  Neurological:     Mental Status: She is alert and oriented to person, place, and time.     Comments: Alert, oriented, thought content appropriate, able to give a coherent history. Speech is clear and goal oriented, able to follow commands.  Cranial Nerves:  II:  Peripheral visual fields grossly normal, pupils equal, round, reactive to light III, IV, VI: EOM intact bilaterally, ptosis not present V,VII: smile symmetric, eyes kept closed tightly against resistance, facial light touch sensation equal VIII: hearing grossly normal IX, X: symmetric soft palate movement, uvula elevates symmetrically  XI: bilateral shoulder shrug symmetric and strong XII: midline tongue extension 5/5 muscle strength in upper and lower extremities bilaterally including strong and equal grip strength and dorsiflexion/plantar  flexion Sensory to light touch normal in all four extremities.      ED Treatments / Results  Labs (all labs ordered are listed, but only abnormal results are displayed) Labs Reviewed  BASIC METABOLIC PANEL - Abnormal; Notable for the following components:      Result Value   Potassium 3.3 (*)    Glucose, Bld 123 (*)    All other components within normal limits  CBC - Abnormal; Notable for the following components:   Hemoglobin 11.7 (*)    MCH 25.3 (*)    MCHC 29.7 (*)    All other components within normal limits  CBG MONITORING, ED    EKG EKG Interpretation  Date/Time:  Tuesday November 05 2018 12:27:18 EST Ventricular Rate:  70 PR Interval:  154 QRS Duration: 86 QT Interval:  420 QTC Calculation: 453 R Axis:   51 Text Interpretation:  Normal sinus rhythm Non-specific ST-t changes Confirmed by Virgel Manifold [phone]) on 11/05/2018 5:50:51 PM   Radiology No results found.  Procedures Procedures (including critical care time)  Medications Ordered in ED Medications  sodium chloride flush (NS) 0.9 % injection 3 mL (3 mLs Intravenous Given 11/05/18 2022)  potassium chloride SA (K-DUR,KLOR-CON) CR tablet 40 mEq (40 mEq Oral Given 11/05/18 1846)  sodium chloride 0.9 % bolus 1,000 mL (0 mLs Intravenous Stopped 11/05/18 2020)  meclizine (ANTIVERT) tablet 25 mg (25 mg Oral Given 11/05/18 1846)  meclizine (ANTIVERT) tablet 25 mg (25 mg Oral Given 11/05/18 2018)  LORazepam (ATIVAN) injection 0.5 mg (0.5 mg Intravenous Given 11/05/18 2018)     Initial Impression / Assessment and Plan / ED Course  I have reviewed the triage vital signs and the nursing notes.  Pertinent labs & imaging results that were available during my care of the patient were reviewed by me and considered in my medical decision making (see chart for details).       [name] is a 53 y.o. female with pmh of vertigo who presents to ED for dizziness c/w her previous vertigo flare. No focal neurologic  deficits on exam. History c/w peripheral etiology. Labs reviewed - mild hypokalemia which was replenished in ED. Feels improved after fluids, meclizine and ativan in ED. She was able to ambulate with steady gait. Did report feeling a little dizzy with ambulation, but states that this is much improved from when she came in. We discussed admission for further work up / treatment / observation vs. Discharge to home. She would like to go home and feels safe and comfortable with doing so. We discussed reasons to return to ER. PCP follow up encouraged. Meclizine rx given. All questions answered.   Patient discussed with Dr. Wilson Singer who agrees with treatment plan.   Final Clinical Impressions(s) / ED Diagnoses   Final diagnoses:  Dizziness    ED Discharge Orders         Ordered    meclizine (ANTIVERT) 25 MG tablet  3 times daily PRN     11/05/18 2238           , Ozella Almond, PA-C 11/05/18 2351    Virgel Manifold, MD 11/07/18 [phone]

## 2018-11-05 NOTE — ED Notes (Addendum)
Patient reports vomiting and diarrhea all day on Saturday, she reports feeling dizzy and weak since Saturday. She also reports generalized.

## 2018-11-05 NOTE — ED Notes (Signed)
Pt notified registration staff that they are leaving due to the wait time.
